# Patient Record
Sex: Female | Born: 1975
Health system: Southern US, Community
[De-identification: ages and names within clinical notes are randomized; demographics above are authoritative.]

## PROBLEM LIST (undated history)

## (undated) DIAGNOSIS — R51 Headache: Secondary | ICD-10-CM

## (undated) DIAGNOSIS — O009 Unspecified ectopic pregnancy without intrauterine pregnancy: Secondary | ICD-10-CM

## (undated) DIAGNOSIS — R519 Headache, unspecified: Secondary | ICD-10-CM

## (undated) DIAGNOSIS — I10 Essential (primary) hypertension: Secondary | ICD-10-CM

## (undated) DIAGNOSIS — G43909 Migraine, unspecified, not intractable, without status migrainosus: Secondary | ICD-10-CM

## (undated) HISTORY — DX: Headache, unspecified: R51.9

## (undated) HISTORY — DX: Headache: R51

## (undated) HISTORY — DX: Migraine, unspecified, not intractable, without status migrainosus: G43.909

## (undated) HISTORY — PX: ECTOPIC PREGNANCY SURGERY: SHX613

---
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
## 2011-12-16 ENCOUNTER — Encounter (HOSPITAL_BASED_OUTPATIENT_CLINIC_OR_DEPARTMENT_OTHER): Payer: Self-pay | Admitting: *Deleted

## 2011-12-16 DIAGNOSIS — Z79899 Other long term (current) drug therapy: Secondary | ICD-10-CM | POA: Insufficient documentation

## 2011-12-16 DIAGNOSIS — I10 Essential (primary) hypertension: Secondary | ICD-10-CM | POA: Insufficient documentation

## 2011-12-16 DIAGNOSIS — K047 Periapical abscess without sinus: Secondary | ICD-10-CM | POA: Insufficient documentation

## 2011-12-16 DIAGNOSIS — K089 Disorder of teeth and supporting structures, unspecified: Secondary | ICD-10-CM | POA: Insufficient documentation

## 2011-12-16 NOTE — ED Notes (Signed)
C/o pain to lower right tooth

## 2011-12-17 ENCOUNTER — Emergency Department (HOSPITAL_BASED_OUTPATIENT_CLINIC_OR_DEPARTMENT_OTHER)
Admission: EM | Admit: 2011-12-17 | Discharge: 2011-12-17 | Disposition: A | Discharge status: Home / Self Care | Payer: 59 | Attending: Emergency Medicine | Admitting: Emergency Medicine

## 2011-12-17 DIAGNOSIS — I10 Essential (primary) hypertension: Secondary | ICD-10-CM

## 2011-12-17 DIAGNOSIS — K047 Periapical abscess without sinus: Secondary | ICD-10-CM

## 2011-12-17 DIAGNOSIS — K0889 Other specified disorders of teeth and supporting structures: Secondary | ICD-10-CM

## 2011-12-17 HISTORY — DX: Unspecified ectopic pregnancy without intrauterine pregnancy: O00.90

## 2011-12-17 HISTORY — DX: Essential (primary) hypertension: I10

## 2011-12-17 MED ORDER — AMOXICILLIN 500 MG PO CAPS: 500.0000 mg | ORAL_CAPSULE | Freq: Three times a day (TID) | ORAL | Status: DC

## 2011-12-17 MED ORDER — HYDROCODONE-ACETAMINOPHEN 5-325 MG PO TABS
2.0000 | ORAL_TABLET | Freq: Once | ORAL | Status: AC
  Administered 2011-12-17: 2 via ORAL
  Filled 2011-12-17: qty 2

## 2011-12-17 MED ORDER — HYDROCODONE-ACETAMINOPHEN 5-500 MG PO TABS: 1.0000 | ORAL_TABLET | Freq: Four times a day (QID) | ORAL | Status: DC | PRN

## 2011-12-17 NOTE — ED Provider Notes (Signed)
History     CSN: [id number]  Arrival date & time 12/16/11  2207   First MD Initiated Contact with Patient 12/17/11 0016      Chief Complaint  Patient presents with  . Dental Pain    (Consider location/radiation/quality/duration/timing/severity/associated sxs/prior treatment) Patient is a 36 y.o. female presenting with tooth pain. The history is provided by the patient.  Dental PainPrimary symptoms do not include headaches, fever or shortness of breath.  Additional symptoms do not include: trouble swallowing.  pt c/o right lower dental pain for past 2-3 days. Constant. Dull. Mod-severe. Worse w chewing. No face or neck swelling. No trouble breathing or swallowing. No fever or chills. Same tooth had previously broken off. No local dentist.   Past Medical History  Diagnosis Date  . Hypertension   . Ectopic pregnancy without intrauterine pregnancy     History reviewed. No pertinent past surgical history.  History reviewed. No pertinent family history.  History  Substance Use Topics  . Smoking status: Never Smoker   . Smokeless tobacco: Not on file  . Alcohol Use: No    OB History    Grav Para Term Preterm Abortions TAB SAB Ect Mult Living                  Review of Systems  Constitutional: Negative for fever.  HENT: Negative for trouble swallowing.   Respiratory: Negative for shortness of breath.   Neurological: Negative for headaches.    Allergies  Review of patient's allergies indicates no known allergies.  Home Medications   Current Outpatient Rx  Name  Route  Sig  Dispense  Refill  . HYDROCHLOROTHIAZIDE 25 MG PO TABS   Oral   Take 25 mg by mouth daily.           BP 179/106  Pulse 88  Temp 98.2 F (36.8 C) (Oral)  Resp 20  Ht 5\' 3"  (1.6 m)  Wt 270 lb (122.471 kg)  BMI 47.83 kg/m2  SpO2 100%  Physical Exam  Nursing note and vitals reviewed. Constitutional: She appears well-developed and well-nourished. No distress.  HENT:  Mouth/Throat:  Oropharynx is clear and moist.       Right lower dental decay, broken off above gumline, associated gum swelling and tenderness. No trismus. No pharyngeal swelling. No pain, swelling or tenderness to face, floor of mouth or neck.   Eyes: Conjunctivae normal are normal. No scleral icterus.  Neck: Neck supple. No tracheal deviation present.  Cardiovascular: Normal rate.   Pulmonary/Chest: Effort normal. No respiratory distress.  Abdominal: Normal appearance. She exhibits no distension.  Musculoskeletal: She exhibits no edema.  Lymphadenopathy:    She has no cervical adenopathy.  Neurological: She is alert.  Skin: Skin is warm and dry. No rash noted.  Psychiatric: She has a normal mood and affect.    ED Course  Procedures (including critical care time)     MDM  Pt has ride, does not have to drive. took motrin earlier. Confirmed nkda w pt.  vicodin 2 po.          Suzi Roots, MD 12/17/11 [phone]

## 2011-12-17 NOTE — Discharge Instructions (Signed)
Take antibiotic (amoxicillin) as prescribed. Take motrin or aleve as need for pain. You may also take vicodin as need for pain. No driving for the next 6 hours or when taking vicodin. Also, do not take tylenol or acetaminophen containing medication when taking vicodin. Follow up with dentist in the next few days  - call office to arrange follow up appointment.  Return to ER if worse, facial/neck swelling, high fevers, intractable pain, trouble breathing or swallowing, other concern.    Your blood pressure is high today - follow up with primary care doctor for recheck in the coming week.     Dental Pain A tooth ache may be caused by cavities (tooth decay). Cavities expose the nerve of the tooth to air and hot or cold temperatures. It may come from an infection or abscess (also called a boil or furuncle) around your tooth. It is also often caused by dental caries (tooth decay). This causes the pain you are having. DIAGNOSIS  Your caregiver can diagnose this problem by exam. TREATMENT   If caused by an infection, it may be treated with medications which kill germs (antibiotics) and pain medications as prescribed by your caregiver. Take medications as directed.  Only take over-the-counter or prescription medicines for pain, discomfort, or fever as directed by your caregiver.  Whether the tooth ache today is caused by infection or dental disease, you should see your dentist as soon as possible for further care. SEEK MEDICAL CARE IF: The exam and treatment you received today has been provided on an emergency basis only. This is not a substitute for complete medical or dental care. If your problem worsens or new problems (symptoms) appear, and you are unable to meet with your dentist, call or return to this location. SEEK IMMEDIATE MEDICAL CARE IF:   You have a fever.  You develop redness and swelling of your face, jaw, or neck.  You are unable to open your mouth.  You have severe pain  uncontrolled by pain medicine. MAKE SURE YOU:   Understand these instructions.  Will watch your condition.  Will get help right away if you are not doing well or get worse. Document Released: 01/24/2005 Document Revised: 04/18/2011 Document Reviewed: 09/12/2007 [facility] Patient Information 2013 [place], [place].     Dental Abscess A dental abscess is a collection of infected fluid (pus) from a bacterial infection in the inner part of the tooth (pulp). It usually occurs at the end of the tooth's root.  CAUSES   Severe tooth decay.  Trauma to the tooth that allows bacteria to enter into the pulp, such as a broken or chipped tooth. SYMPTOMS   Severe pain in and around the infected tooth.  Swelling and redness around the abscessed tooth or in the mouth or face.  Tenderness.  Pus drainage.  Bad breath.  Bitter taste in the mouth.  Difficulty swallowing.  Difficulty opening the mouth.  Nausea.  Vomiting.  Chills.  Swollen neck glands. DIAGNOSIS   A medical and dental history will be taken.  An examination will be performed by tapping on the abscessed tooth.  X-rays may be taken of the tooth to identify the abscess. TREATMENT The goal of treatment is to eliminate the infection. You may be prescribed antibiotic medicine to stop the infection from spreading. A root canal may be performed to save the tooth. If the tooth cannot be saved, it may be pulled (extracted) and the abscess may be drained.  HOME CARE INSTRUCTIONS  Only take  over-the-counter or prescription medicines for pain, fever, or discomfort as directed by your caregiver.  Rinse your mouth (gargle) often with salt water ( tsp salt in 8 oz of warm water) to relieve pain or swelling.  Do not drive after taking pain medicine (narcotics).  Do not apply heat to the outside of your face.  Return to your dentist for further treatment as directed. SEEK MEDICAL CARE IF:  Your pain is not helped by  medicine.  Your pain is getting worse instead of better. SEEK IMMEDIATE MEDICAL CARE IF:  You have a fever or persistent symptoms for more than 2 3 days.  You have a fever and your symptoms suddenly get worse.  You have chills or a very bad headache.  You have problems breathing or swallowing.  You have trouble opening your mouth.  You have swelling in the neck or around the eye. Document Released: 01/24/2005 Document Revised: 07/26/2011 Document Reviewed: 05/04/2010 [facility] Patient Information 2013 [place], [place].    Hypertension As your heart beats, it forces blood through your arteries. This force is your blood pressure. If the pressure is too high, it is called hypertension (HTN) or high blood pressure. HTN is dangerous because you may have it and not know it. High blood pressure may mean that your heart has to work harder to pump blood. Your arteries may be narrow or stiff. The extra work puts you at risk for heart disease, stroke, and other problems.  Blood pressure consists of two numbers, a higher number over a lower, 110/72, for example. It is stated as "110 over 72." The ideal is below 120 for the top number (systolic) and under 80 for the bottom (diastolic). Write down your blood pressure today. You should pay close attention to your blood pressure if you have certain conditions such as:  Heart failure.  Prior heart attack.  Diabetes  Chronic kidney disease.  Prior stroke.  Multiple risk factors for heart disease. To see if you have HTN, your blood pressure should be measured while you are seated with your arm held at the level of the heart. It should be measured at least twice. A one-time elevated blood pressure reading (especially in the Emergency Department) does not mean that you need treatment. There may be conditions in which the blood pressure is different between your right and left arms. It is important to see your caregiver soon for a recheck. Most people  have essential hypertension which means that there is not a specific cause. This type of high blood pressure may be lowered by changing lifestyle factors such as:  Stress.  Smoking.  Lack of exercise.  Excessive weight.  Drug/tobacco/alcohol use.  Eating less salt. Most people do not have symptoms from high blood pressure until it has caused damage to the body. Effective treatment can often prevent, delay or reduce that damage. TREATMENT  When a cause has been identified, treatment for high blood pressure is directed at the cause. There are a large number of medications to treat HTN. These fall into several categories, and your caregiver will help you select the medicines that are best for you. Medications may have side effects. You should review side effects with your caregiver. If your blood pressure stays high after you have made lifestyle changes or started on medicines,   Your medication(s) may need to be changed.  Other problems may need to be addressed.  Be certain you understand your prescriptions, and know how and when to take your medicine.  Be sure to follow up with your caregiver within the time frame advised (usually within two weeks) to have your blood pressure rechecked and to review your medications.  If you are taking more than one medicine to lower your blood pressure, make sure you know how and at what times they should be taken. Taking two medicines at the same time can result in blood pressure that is too low. SEEK IMMEDIATE MEDICAL CARE IF:  You develop a severe headache, blurred or changing vision, or confusion.  You have unusual weakness or numbness, or a faint feeling.  You have severe chest or abdominal pain, vomiting, or breathing problems. MAKE SURE YOU:   Understand these instructions.  Will watch your condition.  Will get help right away if you are not doing well or get worse. Document Released: 01/24/2005 Document Revised: 04/18/2011 Document  Reviewed: 09/14/2007 [facility] Patient Information 2013 [place], [place].     RESOURCE GUIDE  Chronic Pain Problems: Contact [facility]  [phone] Patients need to be referred by their primary care doctor.  Insufficient Money for Medicine: Contact United Way:  call "211" or [facility] 3653540222.  No Primary Care Doctor: - Call [facility]  [phone] - can help you locate a primary care doctor that  accepts your insurance, provides certain services, etc. - Physician Referral Service- [phone]  Agencies that provide inexpensive medical care: - [facility]  [phone] - [facility]  [phone] - [facility]  [phone] - Women's Clinic  [phone] - Planned Parenthood  [phone] [facility]  [phone]  Medicaid-accepting [facility] Providers: - [facility]- [address]  [phone], Mon-Fri 9am-7pm, Sat 9am-1pm - [facility]- [address] [place], [address] [place]  [phone] - [facility]- [address] [place]  [phone] [facility]- [address]  [phone] - Renaye Rakers- [address] [place], [address], [phone]  Only accepts [place] patients after they have their name  applied to their card  Self Pay (no insurance) in [place]: - Sickle Cell Patients: Dr Willey Blade, [facility]  [address] [place], [phone] - [facility]- [address] [place]  [phone]       [facility]- [address]       -     [facility]- see information above (Speak to [employer] if you do not have insurance)       -  [facility]- [address] [place], [phone]       -  [facility]- [address],  [phone]       -  [facility]- [address], [phone]       -  Dr Julio Sicks-  [address], [place], [phone]       -  [facility]- [address] [phone]       -  [facility]- [address], [phone], also [address], [phone]       -    [facility]- [address] [place], [phone], 1st & 3rd Saturday   every month, 10am-1pm  1) Find a Doctor and Pay Out of Pocket Although you won't have to find  out who is covered by your insurance plan, it is a good idea to ask around and get recommendations. You will then need to call the office and see if the doctor you have chosen will accept you as a new patient and what types of options they offer for patients who are self-pay. Some doctors offer discounts or will set up payment plans for their patients who do not have insurance, but you will need to ask so you aren't surprised when you get to your appointment.  2) Contact Your Local Health Department Not all health departments have doctors that can see patients for sick visits, but many do, so it is worth a call to see if yours does. If you don't know where your local health department is, you can check in your phone book. The CDC also has a tool to help you locate your state's health department, and many state websites also have listings of all of their local health departments.  3) Find a Walk-in Clinic If your illness is not likely to be very severe or complicated, you may want to try a walk in clinic. These are popping up all over the country in pharmacies, drugstores, and shopping centers. They're usually staffed by nurse practitioners or physician assistants that have been trained to treat common illnesses and complaints. They're usually fairly quick and inexpensive. However, if you have serious medical issues or chronic medical problems, these are probably not your best option  STD Testing - [facility] Department of [facility], STD Clinic, [address] [place], phone [phone] or  [phone].  Monday - Friday, call for an appointment. [facility] Department of [employer], STD Clinic, [address], [place], phone [phone] or [phone].  Monday - Friday, call for an appointment.  Abuse/Neglect: [facility] Child Abuse Hotline (906) 155-1085 [facility] Child Abuse Hotline [phone] (After Hours)  Emergency Shelter:  [facility] [phone]  Maternity Homes: - Room at the [place] [facility] (616)522-6281 - [facility] [phone]  MRSA Hotline #:   (808) 347-0446  [facility] Resources  [facility] [place]  United Way [facility] Dept. [address]                 [address]         [address] [place] [address]  Blondell Reveal Phone:  [phone]                                  Phone:  [phone]                   Phone:  [phone]  [facility], [phone] - [facility]([phone])[phone]       -     [facility] in [place], [address],  ([phone], [facility] Child Abuse Hotline (407)465-8308 or (647) 602-0339 (After Hours)   Behavioral Health Services  Substance Abuse Resources: - [facility] and Drug Services  401-679-7466 - [facility] (571)378-1619 - [facility] (586)588-8243 Floydene Flock (667)038-6651 - Residential & Outpatient Substance Abuse Program  9417996279  Psychological Services: [facility]  ([phone] [facility]  [phone] - [facility], [phone] [place]. [address], [place], ACCESS LINE: 920-800-8233 or (331)774-5678, [employer]  Dental Assistance  If unable to pay or uninsured, contact:  [facility] or  [facility]. to become qualified for the adult dental clinic.  Patients with Medicaid: [facility] [phone] [address], [phone] [address] [phone]  If unable to pay, or uninsured, contact [facility] [phone]) or [facility] Department [phone] in [place], [phone] in [facility]) to become qualified for the adult dental clinic  Other Low-Cost Community Dental Services: - [facility]- [address] [place], [place], [place], [address], [phone], [address]. [phone], 2nd and 4th Thursday of the month at 6:30am.  10 clients each day by appointment, can sometimes see walk-in patients if someone does not show for an appointment. [facility]- [address] [place], [place], [address], [phone] - [facility]- [address], [place], [place], [address], [phone] - [facility]- [phone] [facility] Health Department- [phone] [facility] Department- [phone]

## 2012-11-28 ENCOUNTER — Emergency Department (HOSPITAL_BASED_OUTPATIENT_CLINIC_OR_DEPARTMENT_OTHER)
Admission: EM | Admit: 2012-11-28 | Discharge: 2012-11-28 | Disposition: A | Discharge status: Home / Self Care | Payer: 59 | Attending: Emergency Medicine | Admitting: Emergency Medicine

## 2012-11-28 ENCOUNTER — Encounter (HOSPITAL_BASED_OUTPATIENT_CLINIC_OR_DEPARTMENT_OTHER): Payer: Self-pay | Admitting: Emergency Medicine

## 2012-11-28 DIAGNOSIS — Z5189 Encounter for other specified aftercare: Secondary | ICD-10-CM | POA: Insufficient documentation

## 2012-11-28 DIAGNOSIS — K029 Dental caries, unspecified: Secondary | ICD-10-CM | POA: Insufficient documentation

## 2012-11-28 DIAGNOSIS — I10 Essential (primary) hypertension: Secondary | ICD-10-CM | POA: Insufficient documentation

## 2012-11-28 DIAGNOSIS — Z792 Long term (current) use of antibiotics: Secondary | ICD-10-CM | POA: Insufficient documentation

## 2012-11-28 DIAGNOSIS — Z79899 Other long term (current) drug therapy: Secondary | ICD-10-CM | POA: Insufficient documentation

## 2012-11-28 MED ORDER — PENICILLIN V POTASSIUM 500 MG PO TABS: 500.0000 mg | ORAL_TABLET | Freq: Three times a day (TID) | ORAL | Status: DC

## 2012-11-28 MED ORDER — HYDROCODONE-ACETAMINOPHEN 5-325 MG PO TABS: 1.0000 | ORAL_TABLET | Freq: Four times a day (QID) | ORAL | Status: DC | PRN

## 2012-11-28 NOTE — ED Notes (Signed)
Pt c/o left sided dental pain x2 days 

## 2012-11-28 NOTE — ED Provider Notes (Signed)
CSN: [id number]     Arrival date & time 11/28/12  0004 History   First MD Initiated Contact with Patient 11/28/12 0010     Chief Complaint  Patient presents with  . Dental Pain   (Consider location/radiation/quality/duration/timing/severity/associated sxs/prior Treatment) Patient is a 37 y.o. female presenting with tooth pain. The history is provided by the patient. A language interpreter was used.  Dental Pain Location:  Lower Lower teeth location:  19/LL 1st molar, 18/LL 2nd molar and 17/LL 3rd molar Quality:  Dull Severity:  Severe Onset quality:  Gradual Duration:  2 days Timing:  Constant Progression:  Unchanged Chronicity:  Recurrent Context: dental caries, dental fracture and poor dentition   Context: not abscess   Previous work-up:  Dental exam and filled cavity Relieved by:  Nothing Worsened by:  Nothing tried Ineffective treatments:  None tried Associated symptoms: neck swelling   Associated symptoms: no congestion and no fever     Past Medical History  Diagnosis Date  . Hypertension   . Ectopic pregnancy without intrauterine pregnancy    History reviewed. No pertinent past surgical history. No family history on file. History  Substance Use Topics  . Smoking status: Never Smoker   . Smokeless tobacco: Not on file  . Alcohol Use: No   OB History   Grav Para Term Preterm Abortions TAB SAB Ect Mult Living                 Review of Systems  Constitutional: Negative for fever.  HENT: Negative for congestion.   All other systems reviewed and are negative.    Allergies  Review of patient's allergies indicates no known allergies.  Home Medications   Current Outpatient Rx  Name  Route  Sig  Dispense  Refill  . amoxicillin (AMOXIL) 500 MG capsule   Oral   Take 1 capsule (500 mg total) by mouth 3 (three) times daily.   21 capsule   0   . hydrochlorothiazide (HYDRODIURIL) 25 MG tablet   Oral   Take 25 mg by mouth daily.         Marland Kitchen  HYDROcodone-acetaminophen (NORCO) 5-325 MG per tablet   Oral   Take 1 tablet by mouth every 6 (six) hours as needed for pain.   10 tablet   0   . HYDROcodone-acetaminophen (VICODIN) 5-500 MG per tablet   Oral   Take 1-2 tablets by mouth every 6 (six) hours as needed for pain.   20 tablet   0   . penicillin v potassium (VEETID) 500 MG tablet   Oral   Take 1 tablet (500 mg total) by mouth 3 (three) times daily.   30 tablet   0    BP 176/100  Pulse 89  Temp(Src) 98.9 F (37.2 C) (Oral)  Resp 18  Ht 5\' 3"  (1.6 m)  Wt 300 lb (136.079 kg)  BMI 53.16 kg/m2  SpO2 99% Physical Exam  Constitutional: She is oriented to person, place, and time. She appears well-developed and well-nourished. No distress.  HENT:  Head: Normocephalic and atraumatic.  Mouth/Throat: Oropharynx is clear and moist.    Eyes: Conjunctivae are normal. Pupils are equal, round, and reactive to light.  Neck: Normal range of motion. Neck supple.  Cardiovascular: Normal rate and regular rhythm.   Pulmonary/Chest: Effort normal and breath sounds normal. She has no wheezes. She has no rales.  Abdominal: Soft. Bowel sounds are normal. There is no tenderness. There is no rebound and no guarding.  Musculoskeletal:  Normal range of motion.  Lymphadenopathy:    She has no cervical adenopathy.  Neurological: She is alert and oriented to person, place, and time.  Skin: Skin is warm and dry.  Psychiatric: She has a normal mood and affect.    ED Course  Procedures (including critical care time) Labs Review Labs Reviewed - No data to display Imaging Review No results found.  EKG Interpretation   None       MDM   1. Dental caries    PCN/ pain medication/ follow up with dentistry     Erza Mothershead Smitty Cords, MD 11/28/12 0020

## 2012-11-28 NOTE — ED Notes (Signed)
Pt states she has not been taking BP medication x 1 month. Pt states she thought she could come off of it. Educated pt about need to take medications as prescribed.

## 2015-05-15 ENCOUNTER — Emergency Department (HOSPITAL_BASED_OUTPATIENT_CLINIC_OR_DEPARTMENT_OTHER)
Admission: EM | Admit: 2015-05-15 | Discharge: 2015-05-15 | Disposition: A | Discharge status: Home / Self Care | Payer: Medicaid Other | Attending: Emergency Medicine | Admitting: Emergency Medicine

## 2015-05-15 ENCOUNTER — Encounter (HOSPITAL_BASED_OUTPATIENT_CLINIC_OR_DEPARTMENT_OTHER): Payer: Self-pay | Admitting: *Deleted

## 2015-05-15 DIAGNOSIS — Z792 Long term (current) use of antibiotics: Secondary | ICD-10-CM | POA: Diagnosis not present

## 2015-05-15 DIAGNOSIS — N760 Acute vaginitis: Secondary | ICD-10-CM

## 2015-05-15 DIAGNOSIS — O23591 Infection of other part of genital tract in pregnancy, first trimester: Secondary | ICD-10-CM | POA: Diagnosis not present

## 2015-05-15 DIAGNOSIS — O9989 Other specified diseases and conditions complicating pregnancy, childbirth and the puerperium: Secondary | ICD-10-CM | POA: Diagnosis present

## 2015-05-15 DIAGNOSIS — Z79899 Other long term (current) drug therapy: Secondary | ICD-10-CM | POA: Diagnosis not present

## 2015-05-15 DIAGNOSIS — O10011 Pre-existing essential hypertension complicating pregnancy, first trimester: Secondary | ICD-10-CM | POA: Insufficient documentation

## 2015-05-15 DIAGNOSIS — B9689 Other specified bacterial agents as the cause of diseases classified elsewhere: Secondary | ICD-10-CM

## 2015-05-15 DIAGNOSIS — Z3A01 Less than 8 weeks gestation of pregnancy: Secondary | ICD-10-CM | POA: Diagnosis not present

## 2015-05-15 LAB — URINALYSIS, ROUTINE W REFLEX MICROSCOPIC
Bilirubin Urine: NEGATIVE
GLUCOSE, UA: NEGATIVE mg/dL
Hgb urine dipstick: NEGATIVE
Ketones, ur: NEGATIVE mg/dL
Nitrite: NEGATIVE
PH: 6 (ref 5.0–8.0)
Protein, ur: NEGATIVE mg/dL
SPECIFIC GRAVITY, URINE: 1.024 (ref 1.005–1.030)

## 2015-05-15 LAB — URINE MICROSCOPIC-ADD ON: RBC / HPF: NONE SEEN RBC/hpf (ref 0–5)

## 2015-05-15 LAB — WET PREP, GENITAL
SPERM: NONE SEEN
Trich, Wet Prep: NONE SEEN
Yeast Wet Prep HPF POC: NONE SEEN

## 2015-05-15 MED ORDER — METRONIDAZOLE 500 MG PO TABS: 500.0000 mg | ORAL_TABLET | Freq: Two times a day (BID) | ORAL | Status: DC

## 2015-05-15 MED FILL — metroNIDAZOLE 500 MG TABS: 500 | 7 days supply | Qty: 14 | Fill #0

## 2015-05-15 NOTE — ED Provider Notes (Signed)
CSN: [id number]     Arrival date & time 05/15/15  1345 History   First MD Initiated Contact with Patient 05/15/15 1358     Chief Complaint  Patient presents with  . Vaginal Discharge     HPI Patient presents with new vaginal discharge over the past 24 hours.  She is currently [redacted] weeks pregnant.  She has had a first trimester ultrasound and she reports that she was told everything seems to be normal bus far.  She does have a history of bacterial vaginosis.  She does not have a new sexual partners.  She denies vaginal bleeding.  She describes this as a foul-smelling vaginal discharge.  No vaginal itching.  She was on antibiotics in February but has not been on antibiotics since then.  She does not douche.  She has been having intercourse with her husband more the past 2 weeks.   Past Medical History  Diagnosis Date  . Hypertension   . Ectopic pregnancy without intrauterine pregnancy    History reviewed. No pertinent past surgical history. No family history on file. Social History  Substance Use Topics  . Smoking status: Never Smoker   . Smokeless tobacco: None  . Alcohol Use: No   OB History    Gravida Para Term Preterm AB TAB SAB Ectopic Multiple Living   1              Review of Systems  All other systems reviewed and are negative.     Allergies  Review of patient's allergies indicates no known allergies.  Home Medications   Prior to Admission medications   Medication Sig Start Date End Date Taking? Authorizing Provider  amoxicillin (AMOXIL) 500 MG capsule Take 1 capsule (500 mg total) by mouth 3 (three) times daily. 12/17/11   Cathren Laine, MD  hydrochlorothiazide (HYDRODIURIL) 25 MG tablet Take 25 mg by mouth daily.    Historical Provider, MD  HYDROcodone-acetaminophen (NORCO) 5-325 MG per tablet Take 1 tablet by mouth every 6 (six) hours as needed for pain. 11/28/12   April Palumbo, MD  HYDROcodone-acetaminophen (VICODIN) 5-500 MG per tablet Take 1-2 tablets by mouth  every 6 (six) hours as needed for pain. 12/17/11   Cathren Laine, MD  penicillin v potassium (VEETID) 500 MG tablet Take 1 tablet (500 mg total) by mouth 3 (three) times daily. 11/28/12   April Palumbo, MD   BP 114/56 mmHg  Pulse 82  Temp(Src) 98.7 F (37.1 C) (Oral)  Resp 18  Ht  (1.6 m)  Wt 315 lb (142.883 kg)  BMI 55.81 kg/m2  SpO2 100% Physical Exam  Constitutional: She is oriented to person, place, and time. She appears well-developed and well-nourished.  HENT:  Head: Normocephalic.  Eyes: EOM are normal.  Neck: Normal range of motion.  Pulmonary/Chest: Effort normal.  Abdominal: She exhibits no distension. There is no tenderness.  Genitourinary:  Normal external genitalia.  Normal-appearing cervix.  No vaginal bleeding encountered.  Small amount of scant vaginal discharge  Musculoskeletal: Normal range of motion.  Neurological: She is alert and oriented to person, place, and time.  Psychiatric: She has a normal mood and affect.  Nursing note and vitals reviewed.   ED Course  Procedures (including critical care time) Labs Review Labs Reviewed  WET PREP, GENITAL - Abnormal; Notable for the following:    Clue Cells Wet Prep HPF POC PRESENT (*)    WBC, Wet Prep HPF POC MODERATE (*)    All other components within normal  limits  URINALYSIS, ROUTINE W REFLEX MICROSCOPIC (NOT AT [facility]) - Abnormal; Notable for the following:    APPearance CLOUDY (*)    Leukocytes, UA TRACE (*)    All other components within normal limits  URINE MICROSCOPIC-ADD ON - Abnormal; Notable for the following:    Squamous Epithelial / LPF 6-30 (*)    Bacteria, UA MANY (*)    All other components within normal limits  GC/CHLAMYDIA PROBE AMP ([place]) NOT AT [facility]    Imaging Review No results found. I have personally reviewed and evaluated these images and lab results as part of my medical decision-making.   EKG Interpretation None      MDM   Final diagnoses:  Bacterial vaginosis     Patient be treated for bacterial vaginosis with 7 days of twice a day Flagyl.  Treatment during first trimester pregnancy consistent with the recommendations of the [facility]   Azalia BilisKevin Reshawn Ostlund, MD 05/15/15 [phone]

## 2015-05-15 NOTE — ED Notes (Addendum)
Vaginal discharge. States she is [redacted] weeks pregnant. She has had a positive pregnancy test and an US that showed an intrauterine pregnancy.

## 2015-05-15 NOTE — Discharge Instructions (Signed)

## 2015-05-18 LAB — GC/CHLAMYDIA PROBE AMP (~~LOC~~) NOT AT ARMC
Chlamydia: NEGATIVE
NEISSERIA GONORRHEA: NEGATIVE

## 2015-06-04 ENCOUNTER — Other Ambulatory Visit (HOSPITAL_COMMUNITY): Payer: Self-pay | Admitting: Obstetrics and Gynecology

## 2015-06-04 ENCOUNTER — Encounter (HOSPITAL_BASED_OUTPATIENT_CLINIC_OR_DEPARTMENT_OTHER): Payer: Self-pay | Admitting: *Deleted

## 2015-06-04 ENCOUNTER — Emergency Department (HOSPITAL_BASED_OUTPATIENT_CLINIC_OR_DEPARTMENT_OTHER)
Admission: EM | Admit: 2015-06-04 | Discharge: 2015-06-04 | Disposition: A | Discharge status: Home / Self Care | Payer: Medicaid Other | Attending: Emergency Medicine | Admitting: Emergency Medicine

## 2015-06-04 ENCOUNTER — Ambulatory Visit (HOSPITAL_COMMUNITY)
Admission: RE | Admit: 2015-06-04 | Discharge: 2015-06-04 | Disposition: A | Discharge status: Home / Self Care | Payer: Medicaid Other | Source: Ambulatory Visit | Attending: Obstetrics and Gynecology | Admitting: Obstetrics and Gynecology

## 2015-06-04 DIAGNOSIS — O161 Unspecified maternal hypertension, first trimester: Secondary | ICD-10-CM | POA: Insufficient documentation

## 2015-06-04 DIAGNOSIS — Z3A10 10 weeks gestation of pregnancy: Secondary | ICD-10-CM | POA: Insufficient documentation

## 2015-06-04 DIAGNOSIS — Z79899 Other long term (current) drug therapy: Secondary | ICD-10-CM | POA: Diagnosis not present

## 2015-06-04 DIAGNOSIS — O3680X1 Pregnancy with inconclusive fetal viability, fetus 1: Secondary | ICD-10-CM

## 2015-06-04 DIAGNOSIS — O4691 Antepartum hemorrhage, unspecified, first trimester: Secondary | ICD-10-CM | POA: Diagnosis present

## 2015-06-04 DIAGNOSIS — O021 Missed abortion: Secondary | ICD-10-CM | POA: Diagnosis not present

## 2015-06-04 DIAGNOSIS — O200 Threatened abortion: Secondary | ICD-10-CM | POA: Insufficient documentation

## 2015-06-04 DIAGNOSIS — R938 Abnormal findings on diagnostic imaging of other specified body structures: Secondary | ICD-10-CM | POA: Insufficient documentation

## 2015-06-04 DIAGNOSIS — O209 Hemorrhage in early pregnancy, unspecified: Secondary | ICD-10-CM | POA: Diagnosis present

## 2015-06-04 LAB — BASIC METABOLIC PANEL
ANION GAP: 5 (ref 5–15)
BUN: 12 mg/dL (ref 6–20)
CHLORIDE: 106 mmol/L (ref 101–111)
CO2: 25 mmol/L (ref 22–32)
Calcium: 8.7 mg/dL — ABNORMAL LOW (ref 8.9–10.3)
Creatinine, Ser: 0.68 mg/dL (ref 0.44–1.00)
GFR calc Af Amer: >60 mL/min (ref >60)
GLUCOSE: 114 mg/dL — AB (ref 65–99)
POTASSIUM: 3.1 mmol/L — AB (ref 3.5–5.1)
Sodium: 136 mmol/L (ref 135–145)

## 2015-06-04 LAB — URINALYSIS, ROUTINE W REFLEX MICROSCOPIC
Bilirubin Urine: NEGATIVE
Glucose, UA: NEGATIVE mg/dL
Ketones, ur: 15 mg/dL — AB
Nitrite: NEGATIVE
Protein, ur: 30 mg/dL — AB
SPECIFIC GRAVITY, URINE: 1.023 (ref 1.005–1.030)
pH: 6 (ref 5.0–8.0)

## 2015-06-04 LAB — CBC WITH DIFFERENTIAL/PLATELET
BASOS ABS: 0 10*3/uL (ref 0.0–0.1)
Basophils Relative: 0 %
EOS PCT: 1 %
Eosinophils Absolute: 0.1 10*3/uL (ref 0.0–0.7)
HEMATOCRIT: 34.6 % — AB (ref 36.0–46.0)
HEMOGLOBIN: 11.6 g/dL — AB (ref 12.0–15.0)
LYMPHS ABS: 3.1 10*3/uL (ref 0.7–4.0)
LYMPHS PCT: 28 %
MCH: 27 pg (ref 26.0–34.0)
MCHC: 33.5 g/dL (ref 30.0–36.0)
MCV: 80.5 fL (ref 78.0–100.0)
Monocytes Absolute: 0.8 10*3/uL (ref 0.1–1.0)
Monocytes Relative: 7 %
NEUTROS ABS: 6.9 10*3/uL (ref 1.7–7.7)
Neutrophils Relative %: 64 %
PLATELETS: 372 10*3/uL (ref 150–400)
RBC: 4.3 MIL/uL (ref 3.87–5.11)
RDW: 14.9 % (ref 11.5–15.5)
WBC: 11 10*3/uL — AB (ref 4.0–10.5)

## 2015-06-04 LAB — WET PREP, GENITAL
Clue Cells Wet Prep HPF POC: NONE SEEN
SPERM: NONE SEEN
TRICH WET PREP: NONE SEEN
WBC WET PREP: NONE SEEN
YEAST WET PREP: NONE SEEN

## 2015-06-04 LAB — PREGNANCY, URINE: PREG TEST UR: POSITIVE — AB

## 2015-06-04 LAB — URINE MICROSCOPIC-ADD ON

## 2015-06-04 LAB — ABO/RH: ABO/RH(D): O POS

## 2015-06-04 LAB — GC/CHLAMYDIA PROBE AMP (~~LOC~~) NOT AT ARMC
CHLAMYDIA, DNA PROBE: NEGATIVE
Neisseria Gonorrhea: NEGATIVE

## 2015-06-04 LAB — HCG, QUANTITATIVE, PREGNANCY: HCG, BETA CHAIN, QUANT, S: 3926 m[IU]/mL — AB (ref <5)

## 2015-06-04 MED ORDER — POTASSIUM CHLORIDE CRYS ER 20 MEQ PO TBCR
40.0000 meq | EXTENDED_RELEASE_TABLET | Freq: Once | ORAL | Status: AC
  Administered 2015-06-04: 40 meq via ORAL
  Filled 2015-06-04: qty 2

## 2015-06-04 NOTE — ED Notes (Signed)
Woke w vaginal bleeding and clots,  Having abd pressure but denies pain

## 2015-06-04 NOTE — ED Notes (Signed)
Pt states woke few minutes ago with vaginal bleeding, w clots states 10 weeks preg,  Denies pain  Just pressure

## 2015-06-04 NOTE — Discharge Instructions (Signed)
Threatened Miscarriage °A threatened miscarriage occurs when you have vaginal bleeding during your first 20 weeks of pregnancy but the pregnancy has not ended. If you have vaginal bleeding during this time, your health care provider will do tests to make sure you are still pregnant. If the tests show you are still pregnant and the developing baby (fetus) inside your womb (uterus) is still growing, your condition is considered a threatened miscarriage. °A threatened miscarriage does not mean your pregnancy will end, but it does increase the risk of losing your pregnancy (complete miscarriage). °CAUSES  °The cause of a threatened miscarriage is usually not known. If you go on to have a complete miscarriage, the most common cause is an abnormal number of chromosomes in the developing baby. Chromosomes are the structures inside cells that hold all your genetic material. °Some causes of vaginal bleeding that do not result in miscarriage include: °· Having sex. °· Having an infection. °· Normal hormone changes of pregnancy. °· Bleeding that occurs when an egg implants in your uterus. °RISK FACTORS °Risk factors for bleeding in early pregnancy include: °· Obesity. °· Smoking. °· Drinking excessive amounts of alcohol or caffeine. °· Recreational drug use. °SIGNS AND SYMPTOMS °· Light vaginal bleeding. °· Mild abdominal pain or cramps. °DIAGNOSIS  °If you have bleeding with or without abdominal pain before 20 weeks of pregnancy, your health care provider will do tests to check whether you are still pregnant. One important test involves using sound waves and a computer (ultrasound) to create images of the inside of your uterus. Other tests include an internal exam of your vagina and uterus (pelvic exam) and measurement of your baby's heart rate.  °You may be diagnosed with a threatened miscarriage if: °· Ultrasound testing shows you are still pregnant. °· Your baby's heart rate is strong. °· A pelvic exam shows that the  opening between your uterus and your vagina (cervix) is closed. °· Your heart rate and blood pressure are stable. °· Blood tests confirm you are still pregnant. °TREATMENT  °No treatments have been shown to prevent a threatened miscarriage from going on to a complete miscarriage. However, the right home care is important.  °HOME CARE INSTRUCTIONS  °· Make sure you keep all your appointments for prenatal care. This is very important. °· Get plenty of rest. °· Do not have sex or use tampons if you have vaginal bleeding. °· Do not douche. °· Do not smoke or use recreational drugs. °· Do not drink alcohol. °· Avoid caffeine. °SEEK MEDICAL CARE IF: °· You have light vaginal bleeding or spotting while pregnant. °· You have abdominal pain or cramping. °· You have a fever. °SEEK IMMEDIATE MEDICAL CARE IF: °· You have heavy vaginal bleeding. °· You have blood clots coming from your vagina. °· You have severe low back pain or abdominal cramps. °· You have fever, chills, and severe abdominal pain. °MAKE SURE YOU: °· Understand these instructions. °· Will watch your condition. °· Will get help right away if you are not doing well or get worse. °  °This information is not intended to replace advice given to you by your health care provider. Make sure you discuss any questions you have with your health care provider. °  °Document Released: 01/24/2005 Document Revised: 01/29/2013 Document Reviewed: 11/20/2012 °Elsevier Interactive Patient Education ©2016 [employer] ° °Vaginal Bleeding During Pregnancy, First Trimester °A small amount of bleeding (spotting) from the vagina is relatively common in early pregnancy. It usually stops on its own.   Various things may cause bleeding or spotting in early pregnancy. Some bleeding may be related to the pregnancy, and some may not. In most cases, the bleeding is normal and is not a problem. However, bleeding can also be a sign of something serious. Be sure to tell your health care provider  about any vaginal bleeding right away. °Some possible causes of vaginal bleeding during the first trimester include: °· Infection or inflammation of the cervix. °· Growths (polyps) on the cervix. °· Miscarriage or threatened miscarriage. °· Pregnancy tissue has developed outside of the uterus and in a fallopian tube (tubal pregnancy). °· Tiny cysts have developed in the uterus instead of pregnancy tissue (molar pregnancy). °HOME CARE INSTRUCTIONS  °Watch your condition for any changes. The following actions may help to lessen any discomfort you are feeling: °· Follow your health care provider's instructions for limiting your activity. If your health care provider orders bed rest, you may need to stay in bed and only get up to use the bathroom. However, your health care provider may allow you to continue light activity. °· If needed, make plans for someone to help with your regular activities and responsibilities while you are on bed rest. °· Keep track of the number of pads you use each day, how often you change pads, and how soaked (saturated) they are. Write this down. °· Do not use tampons. Do not douche. °· Do not have sexual intercourse or orgasms until approved by your health care provider. °· If you pass any tissue from your vagina, save the tissue so you can show it to your health care provider. °· Only take over-the-counter or prescription medicines as directed by your health care provider. °· Do not take aspirin because it can make you bleed. °· Keep all follow-up appointments as directed by your health care provider. °SEEK MEDICAL CARE IF: °· You have any vaginal bleeding during any part of your pregnancy. °· You have cramps or labor pains. °· You have a fever, not controlled by medicine. °SEEK IMMEDIATE MEDICAL CARE IF:  °· You have severe cramps in your back or belly (abdomen). °· You pass large clots or tissue from your vagina. °· Your bleeding increases. °· You feel light-headed or weak, or you have  fainting episodes. °· You have chills. °· You are leaking fluid or have a gush of fluid from your vagina. °· You pass out while having a bowel movement. °MAKE SURE YOU: °· Understand these instructions. °· Will watch your condition. °· Will get help right away if you are not doing well or get worse. °  °This information is not intended to replace advice given to you by your health care provider. Make sure you discuss any questions you have with your health care provider. °  °Document Released: 11/03/2004 Document Revised: 01/29/2013 Document Reviewed: 10/01/2012 °Elsevier Interactive Patient Education ©2016 Elsevier Inc. ° °

## 2015-06-04 NOTE — ED Provider Notes (Signed)
CSN: [id number]     Arrival date & time 06/04/15  4098 History   First MD Initiated Contact with Patient 06/04/15 [phone]     Chief Complaint  Patient presents with  . Vaginal Bleeding     (Consider location/radiation/quality/duration/timing/severity/associated sxs/prior Treatment) Patient is a 40 y.o. female presenting with vaginal bleeding. The history is provided by the patient.  Vaginal Bleeding Quality:  Clots and dark red Severity:  Moderate Onset quality:  Sudden Timing:  Constant Progression:  Unchanged Chronicity:  New Possible pregnancy: yes   Context: at rest and spontaneously   Context: not after intercourse and not genital trauma   Relieved by:  Nothing Worsened by:  Nothing tried Ineffective treatments:  None tried Associated symptoms: no abdominal pain and no fever   Risk factors: no hx of ectopic pregnancy and no STD   G3P1011 at 10 weeks 2 days by [place] and LMP.  Patient does not believe she received Rhogam during her last pregnancy.  She has a procedure at 14 for Left ectopic with lysis of adhesions to the sigmoid colon  Past Medical History  Diagnosis Date  . Hypertension   . Ectopic pregnancy without intrauterine pregnancy    History reviewed. No pertinent past surgical history. History reviewed. No pertinent family history. Social History  Substance Use Topics  . Smoking status: Never Smoker   . Smokeless tobacco: None  . Alcohol Use: No   OB History    Gravida Para Term Preterm AB TAB SAB Ectopic Multiple Living   1              Review of Systems  Constitutional: Negative for fever.  Gastrointestinal: Negative for abdominal pain.  Genitourinary: Positive for vaginal bleeding.  All other systems reviewed and are negative.     Allergies  Review of patient's allergies indicates no known allergies.  Home Medications   Prior to Admission medications   Medication Sig Start Date End Date Taking? Authorizing Provider  hydrochlorothiazide  (HYDRODIURIL) 25 MG tablet Take 25 mg by mouth daily.    Historical Provider, MD  metroNIDAZOLE (FLAGYL) 500 MG tablet Take 1 tablet (500 mg total) by mouth 2 (two) times daily. 05/15/15   Azalia Bilis, MD   BP 107/77 mmHg  Pulse 99  Temp(Src) 98.6 F (37 C) (Oral)  Resp 18  Ht  (1.6 m)  Wt 322 lb (146.058 kg)  BMI 57.05 kg/m2  SpO2 100% Physical Exam  Constitutional: She is oriented to person, place, and time. She appears well-developed and well-nourished. No distress.  HENT:  Head: Normocephalic and atraumatic.  Mouth/Throat: Oropharynx is clear and moist.  Eyes: Conjunctivae are normal. Pupils are equal, round, and reactive to light.  Neck: Normal range of motion. Neck supple.  Cardiovascular: Normal rate, regular rhythm and intact distal pulses.   Pulmonary/Chest: Effort normal and breath sounds normal. No respiratory distress. She has no wheezes. She has no rales.  Abdominal: Soft. Bowel sounds are normal. There is no tenderness. There is no rebound.  Genitourinary: There is bleeding in the vagina.  Chaperone present, os closed evacuated one clot then scant discharge  Musculoskeletal: Normal range of motion.  Neurological: She is alert and oriented to person, place, and time.  Skin: Skin is warm and dry.  Psychiatric:  tearful    ED Course  Procedures (including critical care time) Labs Review Labs Reviewed  URINALYSIS, ROUTINE W REFLEX MICROSCOPIC (NOT AT [facility]) - Abnormal; Notable for the following:    Color, Urine  RED (*)    APPearance CLOUDY (*)    Hgb urine dipstick LARGE (*)    Ketones, ur 15 (*)    Protein, ur 30 (*)    Leukocytes, UA SMALL (*)    All other components within normal limits  PREGNANCY, URINE - Abnormal; Notable for the following:    Preg Test, Ur POSITIVE (*)    All other components within normal limits  URINE MICROSCOPIC-ADD ON - Abnormal; Notable for the following:    Squamous Epithelial / LPF 0-5 (*)    Bacteria, UA RARE (*)    All  other components within normal limits  CBC WITH DIFFERENTIAL/PLATELET - Abnormal; Notable for the following:    WBC 11.0 (*)    Hemoglobin 11.6 (*)    HCT 34.6 (*)    All other components within normal limits  WET PREP, GENITAL  BASIC METABOLIC PANEL  ABO/RH  GC/CHLAMYDIA PROBE AMP ([place]) NOT AT [facility]    Imaging Review No results found. I have personally reviewed and evaluated these images and lab results as part of my medical decision-making.   EKG Interpretation None      MDM   Final diagnoses:  None   Filed Vitals:   06/04/15 0329  BP: 107/77  Pulse: 99  Temp: 98.6 F (37 C)  Resp: 18   Results for orders placed or performed during the hospital encounter of 06/04/15  Wet prep, genital  Result Value Ref Range   Yeast Wet Prep HPF POC NONE SEEN NONE SEEN   Trich, Wet Prep NONE SEEN NONE SEEN   Clue Cells Wet Prep HPF POC NONE SEEN NONE SEEN   WBC, Wet Prep HPF POC NONE SEEN NONE SEEN   Sperm NONE SEEN   Urinalysis, Routine w reflex microscopic (not at [facility])  Result Value Ref Range   Color, Urine RED (A) YELLOW   APPearance CLOUDY (A) CLEAR   Specific Gravity, Urine 1.023 1.005 - 1.030   pH 6.0 5.0 - 8.0   Glucose, UA NEGATIVE NEGATIVE mg/dL   Hgb urine dipstick LARGE (A) NEGATIVE   Bilirubin Urine NEGATIVE NEGATIVE   Ketones, ur 15 (A) NEGATIVE mg/dL   Protein, ur 30 (A) NEGATIVE mg/dL   Nitrite NEGATIVE NEGATIVE   Leukocytes, UA SMALL (A) NEGATIVE  Pregnancy, urine  Result Value Ref Range   Preg Test, Ur POSITIVE (A) NEGATIVE  Urine microscopic-add on  Result Value Ref Range   Squamous Epithelial / LPF 0-5 (A) NONE SEEN   WBC, UA 0-5 0 - 5 WBC/hpf   RBC / HPF TOO NUMEROUS TO COUNT 0 - 5 RBC/hpf   Bacteria, UA RARE (A) NONE SEEN   Urine-Other MUCOUS PRESENT   CBC with Differential/Platelet  Result Value Ref Range   WBC 11.0 (H) 4.0 - 10.5 K/uL   RBC 4.30 3.87 - 5.11 MIL/uL   Hemoglobin 11.6 (L) 12.0 - 15.0 g/dL   HCT 16.134.6 (L) 09.636.0 - 04.546.0 %    MCV 80.5 78.0 - 100.0 fL   MCH 27.0 26.0 - 34.0 pg   MCHC 33.5 30.0 - 36.0 g/dL   RDW 40.914.9 81.111.5 - 91.415.5 %   Platelets 372 150 - 400 K/uL   Neutrophils Relative % 64 %   Neutro Abs 6.9 1.7 - 7.7 K/uL   Lymphocytes Relative 28 %   Lymphs Abs 3.1 0.7 - 4.0 K/uL   Monocytes Relative 7 %   Monocytes Absolute 0.8 0.1 - 1.0 K/uL   Eosinophils Relative 1 %  Eosinophils Absolute 0.1 0.0 - 0.7 K/uL   Basophils Relative 0 %   Basophils Absolute 0.0 0.0 - 0.1 K/uL   No results found.  Medications  potassium chloride SA (K-DUR,KLOR-CON) CR tablet 40 mEq (40 mEq Oral Given 06/04/15 0518)    ABO RH sent--- it is a send out and will take several hours to return  Case d/w Lurena Joiner midwife for [facility] on 05/12/15 patient had a confirmed IUP at 7 weeks.  Pelvic rest and follow up in the office.  If bleeding persists follow up at [facility].   Patient is aware that it will take several hours for this test to return.  She will need to have [facility] follow up on her blood type and if she is RH- will need Rhogam.  She will ask during her appointment.  We will print this information on her discharge paperwork  She understands that she is to maintain strict pelvic rest and drink lots of fluids and to avoid stress.  She understands that if symptoms worsen she should present to [facility] and that she must call her OB to be seen today.     Stable for discharge call office in the am to be seen.      Cy Blamer, MD 06/04/15 [phone]

## 2015-06-04 NOTE — ED Notes (Signed)
Pt received written and verbal instructions to call to see her md today and also to ask about rh factor

## 2016-01-17 ENCOUNTER — Encounter (HOSPITAL_BASED_OUTPATIENT_CLINIC_OR_DEPARTMENT_OTHER): Payer: Self-pay | Admitting: *Deleted

## 2016-01-17 ENCOUNTER — Emergency Department (HOSPITAL_BASED_OUTPATIENT_CLINIC_OR_DEPARTMENT_OTHER)
Admission: EM | Admit: 2016-01-17 | Discharge: 2016-01-17 | Disposition: A | Discharge status: Home / Self Care | Payer: Medicaid Other | Attending: Emergency Medicine | Admitting: Emergency Medicine

## 2016-01-17 DIAGNOSIS — I10 Essential (primary) hypertension: Secondary | ICD-10-CM | POA: Insufficient documentation

## 2016-01-17 DIAGNOSIS — Y929 Unspecified place or not applicable: Secondary | ICD-10-CM | POA: Insufficient documentation

## 2016-01-17 DIAGNOSIS — Y9389 Activity, other specified: Secondary | ICD-10-CM | POA: Insufficient documentation

## 2016-01-17 DIAGNOSIS — Z79899 Other long term (current) drug therapy: Secondary | ICD-10-CM | POA: Insufficient documentation

## 2016-01-17 DIAGNOSIS — Y990 Civilian activity done for income or pay: Secondary | ICD-10-CM | POA: Diagnosis not present

## 2016-01-17 DIAGNOSIS — G5601 Carpal tunnel syndrome, right upper limb: Secondary | ICD-10-CM

## 2016-01-17 DIAGNOSIS — S6991XA Unspecified injury of right wrist, hand and finger(s), initial encounter: Secondary | ICD-10-CM | POA: Diagnosis present

## 2016-01-17 DIAGNOSIS — X503XXA Overexertion from repetitive movements, initial encounter: Secondary | ICD-10-CM | POA: Diagnosis not present

## 2016-01-17 DIAGNOSIS — M79641 Pain in right hand: Secondary | ICD-10-CM

## 2016-01-17 MED ORDER — PREDNISONE 10 MG PO TABS: 20.0000 mg | ORAL_TABLET | Freq: Two times a day (BID) | ORAL | 0 refills | Status: DC

## 2016-01-17 MED ORDER — TRAMADOL HCL 50 MG PO TABS: 50.0000 mg | ORAL_TABLET | Freq: Four times a day (QID) | ORAL | 0 refills | Status: DC | PRN

## 2016-01-17 NOTE — ED Provider Notes (Signed)
[facility]   CSN: [id number] Arrival date & time: 01/17/16  [id number]     History   Chief Complaint Chief Complaint  Patient presents with  . Hand Pain    HPI Vicki Lawrence is a 40 y.o. female.  Patient is a 40 year old female with no significant past medical history. She presents for evaluation of right hand pain. This has been worsening over the past several days. She denies any specific injury or trauma, but does report performing repetitive motions at work. She denies any weakness but does report some numbness and tingling. She denies any significant shoulder, neck, or elbow pain.   The history is provided by the patient.  Hand Pain  This is a new problem. Episode onset: Several days ago. The problem occurs constantly. The problem has been gradually worsening. Exacerbated by: Movement and palpation. Nothing relieves the symptoms.    Past Medical History:  Diagnosis Date  . Ectopic pregnancy without intrauterine pregnancy   . Hypertension     There are no active problems to display for this patient.   History reviewed. No pertinent surgical history.  OB History    Gravida Para Term Preterm AB Living   1             SAB TAB Ectopic Multiple Live Births                   Home Medications    Prior to Admission medications   Medication Sig Start Date End Date Taking? Authorizing Provider  hydrochlorothiazide (HYDRODIURIL) 25 MG tablet Take 25 mg by mouth daily.    Historical Provider, MD  metroNIDAZOLE (FLAGYL) 500 MG tablet Take 1 tablet (500 mg total) by mouth 2 (two) times daily. 05/15/15   Azalia BilisKevin Campos, MD    Family History No family history on file.  Social History Social History  Substance Use Topics  . Smoking status: Never Smoker  . Smokeless tobacco: Not on file  . Alcohol use No     Allergies   Patient has no known allergies.   Review of Systems Review of Systems  All other systems reviewed and are  negative.    Physical Exam Updated Vital Signs BP 147/92 (BP Location: Right Arm)   Pulse 79   Temp 98.3 F (36.8 C)   Resp 19   SpO2 100%   Physical Exam  Constitutional: She is oriented to person, place, and time. She appears well-developed and well-nourished.  HENT:  Head: Normocephalic and atraumatic.  Neck: Normal range of motion. Neck supple.  Musculoskeletal:  The right hand and wrist appear grossly normal. There is some tenderness over the volar aspect of the wrist. She is able to flex, extend, and oppose all fingers. Strength is normal. Sensation is intact throughout the entire hand.  Neurological: She is alert and oriented to person, place, and time.  Skin: Skin is warm and dry.  Nursing note and vitals reviewed.    ED Treatments / Results  Labs (all labs ordered are listed, but only abnormal results are displayed) Labs Reviewed - No data to display  EKG  EKG Interpretation None       Radiology No results found.  Procedures Procedures (including critical care time)  Medications Ordered in ED Medications - No data to display   Initial Impression / Assessment and Plan / ED Course  I have reviewed the triage vital signs and the nursing notes.  Pertinent labs & imaging results that were available  during my care of the patient were reviewed by me and considered in my medical decision making (see chart for details).  Clinical Course     Patient's complaints and exam are most consistent with a carpal tunnel syndrome. She will be treated with a wrist splint, prednisone, tramadol, and when necessary follow-up with her primary Dr. if her symptoms are not improving.  Final Clinical Impressions(s) / ED Diagnoses   Final diagnoses:  None    New Prescriptions New Prescriptions   No medications on file     Geoffery Lyonsouglas Ronniesha Seibold, MD 01/17/16 [phone]

## 2016-01-17 NOTE — Discharge Instructions (Signed)
Wear wrist splint as applied as much as possible for the next several days.  Prednisone as prescribed. Tramadol as prescribed as needed for pain.  Follow-up with your primary Dr. if your symptoms are not improving in the next week.

## 2016-01-17 NOTE — ED Notes (Signed)
Dr. Judd Lienelo in room with patient now.

## 2017-06-13 ENCOUNTER — Emergency Department (HOSPITAL_BASED_OUTPATIENT_CLINIC_OR_DEPARTMENT_OTHER)
Admission: EM | Admit: 2017-06-13 | Discharge: 2017-06-13 | Disposition: A | Discharge status: Home / Self Care | Payer: 59 | Attending: Emergency Medicine | Admitting: Emergency Medicine

## 2017-06-13 ENCOUNTER — Emergency Department (HOSPITAL_BASED_OUTPATIENT_CLINIC_OR_DEPARTMENT_OTHER): Payer: 59

## 2017-06-13 ENCOUNTER — Other Ambulatory Visit: Payer: Self-pay

## 2017-06-13 ENCOUNTER — Encounter (HOSPITAL_BASED_OUTPATIENT_CLINIC_OR_DEPARTMENT_OTHER): Payer: Self-pay | Admitting: *Deleted

## 2017-06-13 DIAGNOSIS — O209 Hemorrhage in early pregnancy, unspecified: Secondary | ICD-10-CM

## 2017-06-13 DIAGNOSIS — O200 Threatened abortion: Secondary | ICD-10-CM | POA: Insufficient documentation

## 2017-06-13 DIAGNOSIS — O10011 Pre-existing essential hypertension complicating pregnancy, first trimester: Secondary | ICD-10-CM | POA: Insufficient documentation

## 2017-06-13 DIAGNOSIS — Z3A01 Less than 8 weeks gestation of pregnancy: Secondary | ICD-10-CM | POA: Diagnosis not present

## 2017-06-13 DIAGNOSIS — N939 Abnormal uterine and vaginal bleeding, unspecified: Secondary | ICD-10-CM

## 2017-06-13 DIAGNOSIS — Z79899 Other long term (current) drug therapy: Secondary | ICD-10-CM | POA: Insufficient documentation

## 2017-06-13 DIAGNOSIS — O23599 Infection of other part of genital tract in pregnancy, unspecified trimester: Secondary | ICD-10-CM | POA: Insufficient documentation

## 2017-06-13 DIAGNOSIS — F1590 Other stimulant use, unspecified, uncomplicated: Secondary | ICD-10-CM | POA: Insufficient documentation

## 2017-06-13 DIAGNOSIS — N760 Acute vaginitis: Secondary | ICD-10-CM

## 2017-06-13 DIAGNOSIS — O208 Other hemorrhage in early pregnancy: Secondary | ICD-10-CM

## 2017-06-13 DIAGNOSIS — B9689 Other specified bacterial agents as the cause of diseases classified elsewhere: Secondary | ICD-10-CM

## 2017-06-13 DIAGNOSIS — O99321 Drug use complicating pregnancy, first trimester: Secondary | ICD-10-CM | POA: Insufficient documentation

## 2017-06-13 LAB — URINALYSIS, MICROSCOPIC (REFLEX)

## 2017-06-13 LAB — URINALYSIS, ROUTINE W REFLEX MICROSCOPIC
Bilirubin Urine: NEGATIVE
Glucose, UA: NEGATIVE mg/dL
KETONES UR: NEGATIVE mg/dL
LEUKOCYTES UA: NEGATIVE
NITRITE: NEGATIVE
PH: 6 (ref 5.0–8.0)
PROTEIN: NEGATIVE mg/dL

## 2017-06-13 LAB — BASIC METABOLIC PANEL
Anion gap: 9 (ref 5–15)
BUN: 11 mg/dL (ref 6–20)
CALCIUM: 8.8 mg/dL — AB (ref 8.9–10.3)
CHLORIDE: 107 mmol/L (ref 101–111)
CO2: 21 mmol/L — AB (ref 22–32)
CREATININE: 0.86 mg/dL (ref 0.44–1.00)
GFR calc Af Amer: >60 mL/min (ref >60)
GFR calc non Af Amer: >60 mL/min (ref >60)
Glucose, Bld: 97 mg/dL (ref 65–99)
Potassium: 3.1 mmol/L — ABNORMAL LOW (ref 3.5–5.1)
SODIUM: 137 mmol/L (ref 135–145)

## 2017-06-13 LAB — PREGNANCY, URINE: Preg Test, Ur: POSITIVE — AB

## 2017-06-13 LAB — CBC WITH DIFFERENTIAL/PLATELET
BASOS PCT: 0 %
Basophils Absolute: 0 10*3/uL (ref 0.0–0.1)
EOS ABS: 0.3 10*3/uL (ref 0.0–0.7)
Eosinophils Relative: 2 %
HCT: 33.8 % — ABNORMAL LOW (ref 36.0–46.0)
HEMOGLOBIN: 11.6 g/dL — AB (ref 12.0–15.0)
LYMPHS ABS: 5.1 10*3/uL — AB (ref 0.7–4.0)
Lymphocytes Relative: 36 %
MCH: 25.6 pg — AB (ref 26.0–34.0)
MCHC: 34.3 g/dL (ref 30.0–36.0)
MCV: 74.6 fL — ABNORMAL LOW (ref 78.0–100.0)
MONO ABS: 0.9 10*3/uL (ref 0.1–1.0)
Monocytes Relative: 6 %
NEUTROS PCT: 56 %
Neutro Abs: 7.8 10*3/uL — ABNORMAL HIGH (ref 1.7–7.7)
Platelets: 479 10*3/uL — ABNORMAL HIGH (ref 150–400)
RBC: 4.53 MIL/uL (ref 3.87–5.11)
RDW: 15.7 % — AB (ref 11.5–15.5)
WBC: 14.1 10*3/uL — ABNORMAL HIGH (ref 4.0–10.5)

## 2017-06-13 LAB — WET PREP, GENITAL
SPERM: NONE SEEN
Trich, Wet Prep: NONE SEEN
Yeast Wet Prep HPF POC: NONE SEEN

## 2017-06-13 LAB — HCG, QUANTITATIVE, PREGNANCY: hCG, Beta Chain, Quant, S: 1159 m[IU]/mL — ABNORMAL HIGH (ref <5)

## 2017-06-13 MED ORDER — METRONIDAZOLE 500 MG PO TABS: 500.0000 mg | ORAL_TABLET | Freq: Two times a day (BID) | ORAL | 0 refills | Status: AC

## 2017-06-13 NOTE — ED Notes (Signed)
Pt. Is in [place] at this time.. RN in to start IV and draw blood work but pt. Not in room.

## 2017-06-13 NOTE — ED Triage Notes (Signed)
She had a positive pregnancy test a week ago. She started having vaginal bleeding this am. She is tearful at triage.

## 2017-06-13 NOTE — ED Provider Notes (Signed)
[facility] EMERGENCY DEPARTMENT Provider Note  CSN: [id number] Arrival date & time: 06/13/17 1442  Chief Complaint(s) Vaginal Bleeding  HPI Vicki Lawrence is a 42 y.o. female   The history is provided by the patient.  Vaginal Bleeding  Primary symptoms include vaginal bleeding. There has been no fever. This is a new problem. The problem has been resolved. She is pregnant. She has missed her period. LMP: March 24. Associated symptoms include abdominal pain (pelvic pressure). Pertinent negatives include no abdominal swelling, no constipation, no diarrhea, no nausea, no vomiting, no light-headedness and no dizziness. She has tried nothing for the symptoms. Associated medical issues include ectopic pregnancy. Associated medical issues comments: missed carriage 2 yrs ago.    Past Medical History Past Medical History:  Diagnosis Date  . Ectopic pregnancy without intrauterine pregnancy   . Hypertension    There are no active problems to display for this patient.  Home Medication(s) Prior to Admission medications   Medication Sig Start Date End Date Taking? Authorizing Provider  hydrochlorothiazide (HYDRODIURIL) 25 MG tablet Take 25 mg by mouth daily.    [provider]  metroNIDAZOLE (FLAGYL) 500 MG tablet Take 1 tablet (500 mg total) by mouth 2 (two) times daily for 7 days. 06/13/17 06/20/17  Nira Conn, MD  predniSONE (DELTASONE) 10 MG tablet Take 2 tablets (20 mg total) by mouth 2 (two) times daily. 01/17/16   Geoffery Lyons, MD  traMADol (ULTRAM) 50 MG tablet Take 1 tablet (50 mg total) by mouth every 6 (six) hours as needed. 01/17/16   Geoffery Lyons, MD                                                                                                                                    Past Surgical History History reviewed. No pertinent surgical history. Family History No family history on file.  Social History Social History   Tobacco Use  .  Smoking status: Never Smoker  . Smokeless tobacco: Never Used  Substance Use Topics  . Alcohol use: No  . Drug use: No   Allergies Patient has no known allergies.  Review of Systems Review of Systems  Gastrointestinal: Positive for abdominal pain (pelvic pressure). Negative for constipation, diarrhea, nausea and vomiting.  Genitourinary: Positive for vaginal bleeding.  Neurological: Negative for dizziness and light-headedness.   All other systems are reviewed and are negative for acute change except as noted in the HPI  Physical Exam Vital Signs  I have reviewed the triage vital signs BP (!) 161/94   Pulse 75   Temp 97.9 F (36.6 C)   Resp 20   Ht  (1.6 m)   Wt (!) 145.2 kg (320 lb)   LMP 04/30/2017   SpO2 100%   BMI 56.69 kg/m   Physical Exam  Constitutional: She is oriented to person, place, and time. She appears well-developed and well-nourished. No distress.  HENT:  Head: Normocephalic and atraumatic.  Right Ear: External ear normal.  Left Ear: External ear normal.  Nose: Nose normal.  Eyes: Conjunctivae and EOM are normal. No scleral icterus.  Neck: Normal range of motion and phonation normal.  Cardiovascular: Normal rate and regular rhythm.  Pulmonary/Chest: Effort normal. No stridor. No respiratory distress.  Abdominal: She exhibits no distension. There is no tenderness. There is no rigidity, no rebound and no guarding.  Genitourinary: Pelvic exam was performed with patient supine. Cervix exhibits no motion tenderness, no discharge and no friability. Right adnexum displays no tenderness. Left adnexum displays no tenderness. No erythema or bleeding in the vagina. No foreign body in the vagina. Vaginal discharge (mild) found.  Genitourinary Comments: Chaperone present during pelvic exam.  Cervical office closed.    Musculoskeletal: Normal range of motion. She exhibits no edema.  Neurological: She is alert and oriented to person, place, and time.  Skin: She  is not diaphoretic.  Psychiatric: She has a normal mood and affect. Her behavior is normal.  Vitals reviewed.   ED Results and Treatments Labs (all labs ordered are listed, but only abnormal results are displayed) Labs Reviewed  WET PREP, GENITAL - Abnormal; Notable for the following components:      Result Value   Clue Cells Wet Prep HPF POC PRESENT (*)    WBC, Wet Prep HPF POC MANY (*)    All other components within normal limits  PREGNANCY, URINE - Abnormal; Notable for the following components:   Preg Test, Ur POSITIVE (*)    All other components within normal limits  URINALYSIS, ROUTINE W REFLEX MICROSCOPIC - Abnormal; Notable for the following components:   APPearance HAZY (*)    Specific Gravity, Urine >1.030 (*)    Hgb urine dipstick SMALL (*)    All other components within normal limits  URINALYSIS, MICROSCOPIC (REFLEX) - Abnormal; Notable for the following components:   Bacteria, UA MANY (*)    All other components within normal limits  CBC WITH DIFFERENTIAL/PLATELET - Abnormal; Notable for the following components:   WBC 14.1 (*)    Hemoglobin 11.6 (*)    HCT 33.8 (*)    MCV 74.6 (*)    MCH 25.6 (*)    RDW 15.7 (*)    Platelets 479 (*)    Neutro Abs 7.8 (*)    Lymphs Abs 5.1 (*)    All other components within normal limits  BASIC METABOLIC PANEL - Abnormal; Notable for the following components:   Potassium 3.1 (*)    CO2 21 (*)    Calcium 8.8 (*)    All other components within normal limits  HCG, QUANTITATIVE, PREGNANCY - Abnormal; Notable for the following components:   hCG, Beta Chain, Quant, S 1,159 (*)    All other components within normal limits  GC/CHLAMYDIA PROBE AMP ([place]) NOT AT [facility]  EKG  EKG Interpretation  Date/Time:    Ventricular Rate:    PR Interval:    QRS Duration:   QT Interval:    QTC Calculation:   R  Axis:     Text Interpretation:        Radiology US Ob Less Than 14 Weeks With Ob Transvaginal  Result Date: 06/13/2017 CLINICAL DATA:  Vaginal bleeding today. EXAM: OBSTETRIC <14 WK [place] AND TRANSVAGINAL OB TECHNIQUE: Both transabdominal and transvaginal ultrasound examinations were performed for complete evaluation of the gestation as well as the maternal uterus, adnexal regions, and pelvic cul-de-sac. Transvaginal technique was performed to assess early pregnancy. Color and duplex Doppler ultrasound was utilized to evaluate blood flow to the ovaries. COMPARISON:  None. FINDINGS: The patient's body habitus (320 pounds weight) results in technical difficulties, leading to diminished sensitivity and specificity. Intrauterine gestational sac: Present, single. Yolk sac:  Not visualized Embryo:  Not visualized Cardiac Activity: Not visualized Heart Rate: Not applicable bpm MSD: 5.0 mm   5 w   2 d Subchorionic hemorrhage:  None visualized. Maternal uterus/adnexae: Not well visualized. IMPRESSION: Suboptimal examination due to patient's body habitus. Intrauterine gestational sac of 5 mm, without visible yolk sac, or embryo. This could represent a 5 week 2 day pregnancy. Correlate clinically with quantitative beta HCG values. Electronically Signed   By: Elsie Stain M.D.   On: 06/13/2017 20:24   Pertinent labs & imaging results that were available during my care of the patient were reviewed by me and considered in my medical decision making (see chart for details).  Medications Ordered in ED Medications - No data to display                                                                                                                                  Procedures Procedures  (including critical care time)  Medical Decision Making / ED Course I have reviewed the nursing notes for this encounter and the patient's prior records (if available in EHR or on provided paperwork).    Vaginal bleeding in the  setting of positive pregnancy test.  Cervical office was closed on exam.  Patient did have some mild vaginal discharge and wet prep consistent with bacterial vaginosis.  Possible implantation bleed versus threatened abortion.  Ultrasound revealed intrauterine gestational sac without yolk sac.  Consistent with hCG quant.  Unable to rule out ectopic pregnancy.  On prior labs patient was noted to be O+ blood type and does not require RhoGam at this time.  All the labs grossly reassuring.  UA without evidence of infection.  We will treat bacterial vaginosis with Flagyl.  Patient recommended to follow-up with women's for quant trending.   The patient appears reasonably screened and/or stabilized for discharge and I doubt any other medical condition or other [facility] requiring further screening, evaluation, or treatment in the ED at this time prior to discharge.  The patient is safe  for discharge with strict return precautions.   Final Clinical Impression(s) / ED Diagnoses Final diagnoses:  Vaginal bleeding  Threatened abortion  Bacterial vaginosis   Disposition: Discharge  Condition: Good  I have discussed the results, Dx and Tx plan with the patient who expressed understanding and agree(s) with the plan. Discharge instructions discussed at great length. The patient was given strict return precautions who verbalized understanding of the instructions. No further questions at time of discharge.    ED Discharge Orders        Ordered    metroNIDAZOLE (FLAGYL) 500 MG tablet  2 times daily     06/13/17 2222       Follow Up: Kathlen Brunswick, MD [address] [place] [address] [phone]  Call  As needed  [facility] [address] [place] [address] [phone] In 2 days for repeat HCG Quant and to establish care with a provider for prenatal care      This chart was dictated using voice recognition software.  Despite best efforts to  proofread,  errors can occur which can change the documentation meaning.   Nira Conn, MD 06/13/17 [phone]

## 2017-06-14 LAB — GC/CHLAMYDIA PROBE AMP (~~LOC~~) NOT AT ARMC
CHLAMYDIA, DNA PROBE: NEGATIVE
NEISSERIA GONORRHEA: NEGATIVE

## 2017-06-16 ENCOUNTER — Other Ambulatory Visit: Payer: 59

## 2017-06-16 DIAGNOSIS — N912 Amenorrhea, unspecified: Secondary | ICD-10-CM

## 2017-06-16 NOTE — Progress Notes (Signed)
Patient presents for HCG redraw. Following up from emergency room. Will determine plan of care based off results. Armandina Stammer RN

## 2017-06-17 LAB — BETA HCG QUANT (REF LAB): hCG Quant: 1672 m[IU]/mL

## 2017-06-20 ENCOUNTER — Other Ambulatory Visit: Payer: 59

## 2017-06-20 DIAGNOSIS — N912 Amenorrhea, unspecified: Secondary | ICD-10-CM

## 2017-06-20 NOTE — Progress Notes (Signed)
Patient presents for repeat HCG.patient schedule to come in next week for follow up and plan of care. Armandina Stammer RN

## 2017-06-21 ENCOUNTER — Telehealth: Payer: Self-pay

## 2017-06-21 ENCOUNTER — Other Ambulatory Visit: Payer: Self-pay

## 2017-06-21 DIAGNOSIS — R799 Abnormal finding of blood chemistry, unspecified: Secondary | ICD-10-CM

## 2017-06-21 DIAGNOSIS — IMO0002 Reserved for concepts with insufficient information to code with codable children: Secondary | ICD-10-CM

## 2017-06-21 LAB — BETA HCG QUANT (REF LAB): hCG Quant: 2942 m[IU]/mL

## 2017-06-21 NOTE — Telephone Encounter (Signed)
Spoke with patient and made her aware that HCG did not really rise appropriately.   Patient made aware that Dr. Erin Fulling is recommending that she have a follow up ultrasound and then follow up in our office. Patient states understanding and we have her schedule for visit on 06-28-17. Patient given imaging number and will schedule her ultrasound before her appointment on 06-28-17. Armandina Stammer RN

## 2017-06-22 ENCOUNTER — Encounter (HOSPITAL_BASED_OUTPATIENT_CLINIC_OR_DEPARTMENT_OTHER): Payer: Self-pay

## 2017-06-22 ENCOUNTER — Ambulatory Visit (HOSPITAL_BASED_OUTPATIENT_CLINIC_OR_DEPARTMENT_OTHER)
Admission: RE | Admit: 2017-06-22 | Discharge: 2017-06-22 | Disposition: A | Discharge status: Home / Self Care | Payer: 59 | Source: Ambulatory Visit | Attending: Obstetrics & Gynecology | Admitting: Obstetrics & Gynecology

## 2017-06-22 DIAGNOSIS — O26891 Other specified pregnancy related conditions, first trimester: Secondary | ICD-10-CM | POA: Insufficient documentation

## 2017-06-22 DIAGNOSIS — Z3A01 Less than 8 weeks gestation of pregnancy: Secondary | ICD-10-CM | POA: Diagnosis not present

## 2017-06-22 DIAGNOSIS — R799 Abnormal finding of blood chemistry, unspecified: Secondary | ICD-10-CM | POA: Insufficient documentation

## 2017-06-22 DIAGNOSIS — O3411 Maternal care for benign tumor of corpus uteri, first trimester: Secondary | ICD-10-CM | POA: Diagnosis not present

## 2017-06-22 DIAGNOSIS — IMO0002 Reserved for concepts with insufficient information to code with codable children: Secondary | ICD-10-CM

## 2017-06-28 ENCOUNTER — Ambulatory Visit (INDEPENDENT_AMBULATORY_CARE_PROVIDER_SITE_OTHER): Payer: 59 | Admitting: Obstetrics & Gynecology

## 2017-06-28 ENCOUNTER — Encounter: Payer: Self-pay | Admitting: Obstetrics & Gynecology

## 2017-06-28 VITALS — BP 134/89 | HR 86 | Ht 63.0 in | Wt 318.0 lb

## 2017-06-28 DIAGNOSIS — O021 Missed abortion: Secondary | ICD-10-CM

## 2017-06-28 MED ORDER — IBUPROFEN 800 MG PO TABS: 800.0000 mg | ORAL_TABLET | Freq: Three times a day (TID) | ORAL | 3 refills | Status: DC | PRN

## 2017-06-28 MED ORDER — MISOPROSTOL 200 MCG PO TABS: ORAL_TABLET | ORAL | 0 refills | Status: DC

## 2017-06-28 NOTE — Progress Notes (Signed)
History:  42 y.o. [id number] here today for eval of possible SAB. LMP 04/30/2017 Pt took a OTC UPT and it was +. I week later she was seen in the ED for vaginal bleeding.  Pt reports an SAB in 2017 and is worried about having another SAB.   Pt reports breast tenderness. She denies bleeding or pain. Pt was not trying to get pregnant and is not sure that she wants to actually conceive. She is worried about the recurrent SABs.  She 'doesn't know if [she] can go through this again.   Prev [place] and qHCG results reviewed.   The following portions of the patient's history were reviewed and updated as appropriate: allergies, current medications, past family history, past medical history, past social history, past surgical history and problem list.  Review of Systems:  Pertinent items are noted in HPI.    Objective:  Physical Exam Blood pressure 134/89, pulse 86, height  (1.6 m), weight (!) 318 lb (144.2 kg), last menstrual period 04/30/2017.  CONSTITUTIONAL: Well-developed, well-nourished female in no acute distress.  HENT:  Normocephalic, atraumatic EYES: Conjunctivae and EOM are normal. No scleral icterus.  NECK: Normal range of motion SKIN: Skin is warm and dry. No rash noted. Not diaphoretic.No pallor. NEUROLGIC: Alert and oriented to person, place, and time. Normal coordination.  Abd: Soft, nontender and nondistended Pelvic: Normal appearing external genitalia; normal appearing vaginal mucosa and cervix.  Normal discharge.  Small uterus, no other palpable masses, no uterine or adnexal tenderness  Labs and Imaging US Ob Comp Less 14 Wks  Result Date: 06/22/2017 CLINICAL DATA:  Abnormal beta HCG possible miscarriage; spotting, cramping, history of prior ectopic pregnancy and LEFT salpingectomy, prior spontaneous abortion x 3, beta HCG 1159 on 06/13/2017, 1672 on 06/16/2017, [id number] on 06/20/2017 EXAM: OBSTETRIC <14 WK [place] AND TRANSVAGINAL OB US TECHNIQUE: Both transabdominal and transvaginal  ultrasound examinations were performed for complete evaluation of the gestation as well as the maternal uterus, adnexal regions, and pelvic cul-de-sac. Transvaginal technique was performed to assess early pregnancy. COMPARISON:  06/13/2017 FINDINGS: Intrauterine gestational sac: Single, located at mid uterus Yolk sac:  Not visualized Embryo:  Not visualized Cardiac Activity: N/A Heart Rate: N/A  bpm MSD: 8  mm   5 w   4  d Subchorionic hemorrhage:  None Maternal uterus/adnexae: Small uterine leiomyomata largest 17 x 16 x 19 mm. RIGHT ovary normal size and morphology 3.9 x 2.5 x 3.0 cm. LEFT ovary normal size and morphology, 3.2 x 4.0 x 2.8 cm. Tiny RIGHT paraovarian cyst 11 mm diameter. Trace free pelvic fluid. No other adnexal masses. IMPRESSION: Gestational sac within the mid uterus with mean sac diameter corresponding to 5 weeks 4 days EGA. No fetal pole identified to establish viability. Small uterine leiomyomata and probable tiny RIGHT paraovarian cyst 11 mm diameter. Electronically Signed   By: Ulyses Southward M.D.   On: 06/22/2017 11:05   US Ob Transvaginal  Result Date: 06/22/2017 CLINICAL DATA:  Abnormal beta HCG possible miscarriage; spotting, cramping, history of prior ectopic pregnancy and LEFT salpingectomy, prior spontaneous abortion x 3, beta HCG 1159 on 06/13/2017, 1672 on 06/16/2017, [id number] on 06/20/2017 EXAM: OBSTETRIC <14 WK [place] AND TRANSVAGINAL OB US TECHNIQUE: Both transabdominal and transvaginal ultrasound examinations were performed for complete evaluation of the gestation as well as the maternal uterus, adnexal regions, and pelvic cul-de-sac. Transvaginal technique was performed to assess early pregnancy. COMPARISON:  06/13/2017 FINDINGS: Intrauterine gestational sac: Single, located at mid uterus Yolk sac:  Not  visualized Embryo:  Not visualized Cardiac Activity: N/A Heart Rate: N/A  bpm MSD: 8  mm   5 w   4  d Subchorionic hemorrhage:  None Maternal uterus/adnexae: Small uterine leiomyomata  largest 17 x 16 x 19 mm. RIGHT ovary normal size and morphology 3.9 x 2.5 x 3.0 cm. LEFT ovary normal size and morphology, 3.2 x 4.0 x 2.8 cm. Tiny RIGHT paraovarian cyst 11 mm diameter. Trace free pelvic fluid. No other adnexal masses. IMPRESSION: Gestational sac within the mid uterus with mean sac diameter corresponding to 5 weeks 4 days EGA. No fetal pole identified to establish viability. Small uterine leiomyomata and probable tiny RIGHT paraovarian cyst 11 mm diameter. Electronically Signed   By: Ulyses Southward M.D.   On: 06/22/2017 11:05   US Ob Less Than 14 Weeks With Ob Transvaginal  Result Date: 06/13/2017 CLINICAL DATA:  Vaginal bleeding today. EXAM: OBSTETRIC <14 WK [place] AND TRANSVAGINAL OB TECHNIQUE: Both transabdominal and transvaginal ultrasound examinations were performed for complete evaluation of the gestation as well as the maternal uterus, adnexal regions, and pelvic cul-de-sac. Transvaginal technique was performed to assess early pregnancy. Color and duplex Doppler ultrasound was utilized to evaluate blood flow to the ovaries. COMPARISON:  None. FINDINGS: The patient's body habitus (320 pounds weight) results in technical difficulties, leading to diminished sensitivity and specificity. Intrauterine gestational sac: Present, single. Yolk sac:  Not visualized Embryo:  Not visualized Cardiac Activity: Not visualized Heart Rate: Not applicable bpm MSD: 5.0 mm   5 w   2 d Subchorionic hemorrhage:  None visualized. Maternal uterus/adnexae: Not well visualized. IMPRESSION: Suboptimal examination due to patient's body habitus. Intrauterine gestational sac of 5 mm, without visible yolk sac, or embryo. This could represent a 5 week 2 day pregnancy. Correlate clinically with quantitative beta HCG values. Electronically Signed   By: Elsie Stain M.D.   On: 06/13/2017 20:24   06/28/2017 Ofc [place]. GS 5 weeks   hCG Quant mIU/mL 2,942  1,672 CM    Assessment & Plan:  Missed abortion- Pt consoled re MAB.  Discussed potential causes of MAB/ SAB. Pt given options of surgical vs medical management. She opts for medical management  Cytotec per vagina q 8 hours x 2    Recurrent SAB- tp would like to f/u for eval. She is not sure if she wants referral to REI at present.   Labs next visit:  Anticardiolipin AB, TSH, FSH, Antimulerrien ab; PT/PTT, HgbA1c  F/u ion 2 weeks or sooner prn  Total face-to-face time with patient was 30 min.  Greater than 50% was spent in counseling andcoordination of care with the patient.   Breanah Faddis L. Harraway-Smith, M.D., [facility]

## 2017-06-28 NOTE — Patient Instructions (Signed)

## 2017-06-28 NOTE — Progress Notes (Signed)
Patient denies any bleeding. Patient following up from serial HCg and ultrasounds.  Armandina Stammer RN

## 2017-07-04 ENCOUNTER — Telehealth: Payer: Self-pay

## 2017-07-04 NOTE — Telephone Encounter (Signed)
Pt was prescribed Cytotec for SAB. Pt was concerned with the bleeding and pain. I explained to pt that the Cytotec is helping her body remove all retained products.Pt states that she bled more over the weekend but is doing better today. Pt advised to take ibuprofen as prescribed for the pain. Understanding was voiced.

## 2017-07-12 ENCOUNTER — Encounter: Payer: Self-pay | Admitting: Obstetrics & Gynecology

## 2017-07-12 ENCOUNTER — Ambulatory Visit: Payer: 59 | Admitting: Obstetrics & Gynecology

## 2017-07-12 ENCOUNTER — Ambulatory Visit (INDEPENDENT_AMBULATORY_CARE_PROVIDER_SITE_OTHER): Payer: 59 | Admitting: Obstetrics & Gynecology

## 2017-07-12 VITALS — BP 138/86 | HR 85 | Ht 63.0 in | Wt 316.0 lb

## 2017-07-12 DIAGNOSIS — O021 Missed abortion: Secondary | ICD-10-CM | POA: Diagnosis not present

## 2017-07-12 DIAGNOSIS — I10 Essential (primary) hypertension: Secondary | ICD-10-CM | POA: Diagnosis not present

## 2017-07-12 MED ORDER — HYDROCHLOROTHIAZIDE 25 MG PO TABS: 25.0000 mg | ORAL_TABLET | Freq: Every day | ORAL | 1 refills | Status: DC

## 2017-07-12 NOTE — Progress Notes (Signed)
History:  42 y.o. [id number] here today for f/u of SAB. She took the Cytotec. She took 2 doses of the Cytotec and noted the bleeding after the second dose. Pt denies pain or bleeding. She does c/o a HA for 2 days.  Pt feels better now and is without pain. She is not interested in conceiving but, she does not want to take contraception.    The following portions of the patient's history were reviewed and updated as appropriate: allergies, current medications, past family history, past medical history, past social history, past surgical history and problem list.  Review of Systems:  Pertinent items are noted in HPI.    Objective:  Physical Exam Blood pressure 138/86, pulse 85, height 5\' 3"  (1.6 m), weight (!) 316 lb (143.3 kg), last menstrual period 04/30/2017.  CONSTITUTIONAL: Well-developed, well-nourished female in no acute distress.  HENT:  Normocephalic, atraumatic EYES: Conjunctivae and EOM are normal. No scleral icterus.  NECK: Normal range of motion SKIN: Skin is warm and dry. No rash noted. Not diaphoretic.No pallor. NEUROLGIC: Alert and oriented to person, place, and time. Normal coordination.   Labs and Imaging [place] Ob Comp Less 14 Wks  Result Date: 06/22/2017 CLINICAL DATA:  Abnormal beta HCG possible miscarriage; spotting, cramping, history of prior ectopic pregnancy and LEFT salpingectomy, prior spontaneous abortion x 3, beta HCG 1159 on 06/13/2017, 1672 on 06/16/2017, [id number] on 06/20/2017 EXAM: OBSTETRIC <14 WK US AND TRANSVAGINAL OB US TECHNIQUE: Both transabdominal and transvaginal ultrasound examinations were performed for complete evaluation of the gestation as well as the maternal uterus, adnexal regions, and pelvic cul-de-sac. Transvaginal technique was performed to assess early pregnancy. COMPARISON:  06/13/2017 FINDINGS: Intrauterine gestational sac: Single, located at mid uterus Yolk sac:  Not visualized Embryo:  Not visualized Cardiac Activity: N/A Heart Rate: N/A  bpm MSD: 8   mm   5 w   4  d Subchorionic hemorrhage:  None Maternal uterus/adnexae: Small uterine leiomyomata largest 17 x 16 x 19 mm. RIGHT ovary normal size and morphology 3.9 x 2.5 x 3.0 cm. LEFT ovary normal size and morphology, 3.2 x 4.0 x 2.8 cm. Tiny RIGHT paraovarian cyst 11 mm diameter. Trace free pelvic fluid. No other adnexal masses. IMPRESSION: Gestational sac within the mid uterus with mean sac diameter corresponding to 5 weeks 4 days EGA. No fetal pole identified to establish viability. Small uterine leiomyomata and probable tiny RIGHT paraovarian cyst 11 mm diameter. Electronically Signed   By: Ulyses SouthwardMark  Boles M.D.   On: 06/22/2017 11:05   [place] Ob Transvaginal  Result Date: 06/22/2017 CLINICAL DATA:  Abnormal beta HCG possible miscarriage; spotting, cramping, history of prior ectopic pregnancy and LEFT salpingectomy, prior spontaneous abortion x 3, beta HCG 1159 on 06/13/2017, 1672 on 06/16/2017, [id number] on 06/20/2017 EXAM: OBSTETRIC <14 WK US AND TRANSVAGINAL OB US TECHNIQUE: Both transabdominal and transvaginal ultrasound examinations were performed for complete evaluation of the gestation as well as the maternal uterus, adnexal regions, and pelvic cul-de-sac. Transvaginal technique was performed to assess early pregnancy. COMPARISON:  06/13/2017 FINDINGS: Intrauterine gestational sac: Single, located at mid uterus Yolk sac:  Not visualized Embryo:  Not visualized Cardiac Activity: N/A Heart Rate: N/A  bpm MSD: 8  mm   5 w   4  d Subchorionic hemorrhage:  None Maternal uterus/adnexae: Small uterine leiomyomata largest 17 x 16 x 19 mm. RIGHT ovary normal size and morphology 3.9 x 2.5 x 3.0 cm. LEFT ovary normal size and morphology, 3.2 x 4.0 x 2.8 cm. Tiny  RIGHT paraovarian cyst 11 mm diameter. Trace free pelvic fluid. No other adnexal masses. IMPRESSION: Gestational sac within the mid uterus with mean sac diameter corresponding to 5 weeks 4 days EGA. No fetal pole identified to establish viability. Small uterine  leiomyomata and probable tiny RIGHT paraovarian cyst 11 mm diameter. Electronically Signed   By: Ulyses Southward M.D.   On: 06/22/2017 11:05   US Ob Less Than 14 Weeks With Ob Transvaginal  Result Date: 06/13/2017 CLINICAL DATA:  Vaginal bleeding today. EXAM: OBSTETRIC <14 WK [place] AND TRANSVAGINAL OB TECHNIQUE: Both transabdominal and transvaginal ultrasound examinations were performed for complete evaluation of the gestation as well as the maternal uterus, adnexal regions, and pelvic cul-de-sac. Transvaginal technique was performed to assess early pregnancy. Color and duplex Doppler ultrasound was utilized to evaluate blood flow to the ovaries. COMPARISON:  None. FINDINGS: The patient's body habitus (320 pounds weight) results in technical difficulties, leading to diminished sensitivity and specificity. Intrauterine gestational sac: Present, single. Yolk sac:  Not visualized Embryo:  Not visualized Cardiac Activity: Not visualized Heart Rate: Not applicable bpm MSD: 5.0 mm   5 w   2 d Subchorionic hemorrhage:  None visualized. Maternal uterus/adnexae: Not well visualized. IMPRESSION: Suboptimal examination due to patient's body habitus. Intrauterine gestational sac of 5 mm, without visible yolk sac, or embryo. This could represent a 5 week 2 day pregnancy. Correlate clinically with quantitative beta HCG values. Electronically Signed   By: Elsie Stain M.D.   On: 06/13/2017 20:24    Assessment & Plan:  Missed abortion- s/p Cytotec  Contraception counseling- pt declines all options She will f/u for next available for annual GYN exam   Elevated BP. H/o benign essential HTN  HCTZ 25 mg 1 po q day refilled  Refer to primary care.    Total face-to-face time with patient was 15 min.  Greater than 50% was spent in counseling and coordination of care with the patient.   Youcef Klas L. Harraway-Smith, M.D., [place] [place]

## 2017-07-12 NOTE — Progress Notes (Signed)
BP in left arm 154/93 Rt arm and 138/86. Pt states that she has been having headaches x2 days.

## 2017-07-13 ENCOUNTER — Encounter: Payer: Self-pay | Admitting: Obstetrics & Gynecology

## 2017-07-13 LAB — BETA HCG QUANT (REF LAB): hCG Quant: 7 m[IU]/mL

## 2017-07-27 ENCOUNTER — Ambulatory Visit (INDEPENDENT_AMBULATORY_CARE_PROVIDER_SITE_OTHER): Payer: 59 | Admitting: Family Medicine

## 2017-07-27 ENCOUNTER — Encounter: Payer: Self-pay | Admitting: Family Medicine

## 2017-07-27 VITALS — BP 128/70 | HR 89 | Temp 98.7°F | Ht 63.0 in | Wt 317.4 lb

## 2017-07-27 DIAGNOSIS — F418 Other specified anxiety disorders: Secondary | ICD-10-CM | POA: Insufficient documentation

## 2017-07-27 DIAGNOSIS — I10 Essential (primary) hypertension: Secondary | ICD-10-CM | POA: Diagnosis not present

## 2017-07-27 DIAGNOSIS — J040 Acute laryngitis: Secondary | ICD-10-CM

## 2017-07-27 LAB — POCT RAPID STREP A (OFFICE): RAPID STREP A SCREEN: NEGATIVE

## 2017-07-27 NOTE — Progress Notes (Signed)
Pre visit review using our clinic review tool, if applicable. No additional management support is needed unless otherwise documented below in the visit note. 

## 2017-07-27 NOTE — Progress Notes (Signed)
Chief Complaint  Patient presents with  . New Patient (Initial Visit)       New Patient Visit SUBJECTIVE: HPI: Vicki Lawrence is an 42 y.o.female who is being seen for establishing care.   1 d lost her voice and has a sore throat. No other s/s's including cough, sob, fevers, ear pain, eye complaints, runny/stuffy nose. +sick contact at work. Has not tried anything at home so far.   Hypertension Patient presents for hypertension follow up. She does not monitor home blood pressures. She is compliant with medication- HCTZ 25 mg/d. Patient has these side effects of medication: none She is in process of cleaning up diet. Exercise: Started walking on treadmill, lifting weights  Pt recently had a miscarriage and has been dealing with some anxiety. This has been going on for around 3 weeks. She does not want to start a medication. She is not following with a counselor or psychologist. She has recently started exercising and feels better.  No Known Allergies  Past Medical History:  Diagnosis Date  . Ectopic pregnancy without intrauterine pregnancy   . Headache   . Hypertension   . Migraines    Past Surgical History:  Procedure Laterality Date  . ECTOPIC PREGNANCY SURGERY  1992   Left salpingectomy per patient   Family History  Problem Relation Age of Onset  . Cancer Paternal Grandfather        prostate  . Cancer Mother [id number]       leukemia  . Hypertension Mother    No Known Allergies  Current Outpatient Medications:  .  hydrochlorothiazide (HYDRODIURIL) 25 MG tablet, Take 1 tablet (25 mg total) by mouth daily., Disp: 30 tablet, Rfl: 1  Patient's last menstrual period was 04/30/2017.  ROS Cardiovascular: Denies chest pain  Respiratory: Denies dyspnea   OBJECTIVE: BP 128/70 (BP Location: Left Arm, Patient Position: Sitting, Cuff Size: Large)   Pulse 89   Temp 98.7 F (37.1 C) (Oral)   Ht 5\' 3"  (1.6 m)   Wt (!) 317 lb 6 oz (144 kg)   LMP 04/30/2017   SpO2 98%    BMI 56.22 kg/m   Constitutional: -  VS reviewed -  Well developed, well nourished, appears stated age -  No apparent distress  Psychiatric: -  Oriented to person, place, and time -  Memory intact -  Affect and mood normal -  Fluent conversation, good eye contact -  Judgment and insight age appropriate  Eye: -  Conjunctivae clear, no discharge -  Pupils symmetric, round, reactive to light  ENMT: -  MMM    Pharynx moist, no exudate, no erythema  Neck: -  No gross swelling, no palpable masses -  Thyroid midline, not enlarged, mobile, no palpable masses  Cardiovascular: -  RRR -  No LE edema  Respiratory: -  Normal respiratory effort, no accessory muscle use, no retraction -  Breath sounds equal, no wheezes, no ronchi, no crackles  Gastrointestinal: -  Bowel sounds normal -  No tenderness, no distention, no guarding, no masses  Skin: -  No significant lesion on inspection -  Warm and dry to palpation   ASSESSMENT/PLAN: Laryngitis - Plan: POCT rapid strep A  Essential hypertension  Situational anxiety  Patient instructed to sign release of records form from her previous PCP. Discussed that #1 is usually viral, but when bacterial is general strep. Testing neg, no further tx needed for this. Supportive care. Cont HCTZ, ck bp's at home. If elevated, will let us  know. Home coping mechs provided in avs. # for [facility] provided for counseling. Pt did not wish to start medication. Is in bereavement phase. Cont exercise. Patient should return in 3 mo to f/u, if doing well will see twice yearly. The patient voiced understanding and agreement to the plan.   Jilda Roche [place], DO 07/27/17  9:53 AM

## 2017-07-27 NOTE — Patient Instructions (Signed)
If your strep test is positive, we will call in antibiotics. If normal, try throat lozenges, air humidifiers and salt water gargles. Most causes of laryngitis are viral though and antibiotics are not helpful.  Check blood pressures at home around 3 times weekly. If well controlled like today, OK to space out checking at home more and more.   Keep up the good work with improving your diet.  Aim to do some physical exertion for 150 minutes per week. This is typically divided into 5 days per week, 30 minutes per day. The activity should be enough to get your heart rate up. Anything is better than nothing if you have time constraints.  Please consider counseling. Contact [phone] to schedule an appointment or inquire about cost/insurance coverage.  Coping skills Choose 5 that work for you:  Take a deep breath  Count to 20  Read a book  Do a puzzle  Meditate  Bake  Sing  Knit  Garden  Pray  Go outside  Call a friend  Listen to music  Take a walk  Color  Send a note  Take a bath  Watch a movie  Be alone in a quiet place  Pet an animal  Visit a friend  Journal  Exercise  Stretch   Let us know if you need anything.

## 2017-07-28 ENCOUNTER — Encounter: Payer: Self-pay | Admitting: Family Medicine

## 2017-07-28 DIAGNOSIS — I10 Essential (primary) hypertension: Secondary | ICD-10-CM | POA: Insufficient documentation

## 2017-07-28 DIAGNOSIS — G43909 Migraine, unspecified, not intractable, without status migrainosus: Secondary | ICD-10-CM | POA: Insufficient documentation

## 2017-09-07 ENCOUNTER — Ambulatory Visit: Payer: 59 | Admitting: Obstetrics & Gynecology

## 2017-09-28 ENCOUNTER — Ambulatory Visit: Payer: 59 | Admitting: Obstetrics & Gynecology

## 2017-10-27 ENCOUNTER — Ambulatory Visit: Payer: 59 | Admitting: Family Medicine

## 2017-10-30 ENCOUNTER — Ambulatory Visit: Payer: 59 | Admitting: Family Medicine

## 2017-10-30 ENCOUNTER — Encounter: Payer: Self-pay | Admitting: Family Medicine

## 2017-10-30 ENCOUNTER — Other Ambulatory Visit: Payer: Self-pay

## 2017-10-30 VITALS — BP 107/80 | HR 88 | Temp 98.3°F | Resp 16 | Ht 63.0 in | Wt 321.0 lb

## 2017-10-30 DIAGNOSIS — S46819A Strain of other muscles, fascia and tendons at shoulder and upper arm level, unspecified arm, initial encounter: Secondary | ICD-10-CM | POA: Diagnosis not present

## 2017-10-30 DIAGNOSIS — I10 Essential (primary) hypertension: Secondary | ICD-10-CM | POA: Diagnosis not present

## 2017-10-30 DIAGNOSIS — B078 Other viral warts: Secondary | ICD-10-CM

## 2017-10-30 NOTE — Patient Instructions (Signed)
Pare down the wart and use Compound W to help get rid of the wart. We can do that here or freeze it, but it is more economical for you to do it at home.  BP looks good today. Stop medicine. Check blood pressure at home. Check and wait 1 minute and check again. Write down readings and bring monitor to next appt.  Heat (pad or rice pillow in microwave) over affected area, 10-15 minutes twice daily.   OK to take Tylenol 1000 mg (2 extra strength tabs) or 975 mg (3 regular strength tabs) every 6 hours as needed.  Trapezius stretches/exercises Do exercises exactly as told by your health care provider and adjust them as directed. It is normal to feel mild stretching, pulling, tightness, or discomfort as you do these exercises, but you should stop right away if you feel sudden pain or your pain gets worse.  Stretching and range of motion exercises These exercises warm up your muscles and joints and improve the movement and flexibility of your shoulder. These exercises can also help to relieve pain, numbness, and tingling. If you are unable to do any of the following for any reason, do not further attempt to do it.   Exercise A: Flexion, standing    1. Stand and hold a broomstick, a cane, or a similar object. Place your hands a little more than shoulder-width apart on the object. Your left / right hand should be palm-up, and your other hand should be palm-down. 2. Push the stick to raise your left / right arm out to your side and then over your head. Use your other hand to help move the stick. Stop when you feel a stretch in your shoulder, or when you reach the angle that is recommended by your health care provider. ? Avoid shrugging your shoulder while you raise your arm. Keep your shoulder blade tucked down toward your spine. 3. Hold for 30 seconds. 4. Slowly return to the starting position. Repeat 2 times. Complete this exercise 3 times per week.  Exercise B: Abduction, supine    1. Lie on  your back and hold a broomstick, a cane, or a similar object. Place your hands a little more than shoulder-width apart on the object. Your left / right hand should be palm-up, and your other hand should be palm-down. 2. Push the stick to raise your left / right arm out to your side and then over your head. Use your other hand to help move the stick. Stop when you feel a stretch in your shoulder, or when you reach the angle that is recommended by your health care provider. ? Avoid shrugging your shoulder while you raise your arm. Keep your shoulder blade tucked down toward your spine. 3. Hold for 30 seconds. 4. Slowly return to the starting position. Repeat 2 times. Complete this exercise 3 times per week.  Exercise C: Flexion, active-assisted    1. Lie on your back. You may bend your knees for comfort. 2. Hold a broomstick, a cane, or a similar object. Place your hands about shoulder-width apart on the object. Your palms should face toward your feet. 3. Raise the stick and move your arms over your head and behind your head, toward the floor. Use your healthy arm to help your left / right arm move farther. Stop when you feel a gentle stretch in your shoulder, or when you reach the angle where your health care provider tells you to stop. 4. Hold for 30 seconds.  5. Slowly return to the starting position. Repeat 2 times. Complete this exercise 3 times per week.  Exercise D: External rotation and abduction    1. Stand in a door frame with one of your feet slightly in front of the other. This is called a staggered stance. 2. Choose one of the following positions as told by your health care provider: ? Place your hands and forearms on the door frame above your head. ? Place your hands and forearms on the door frame at the height of your head. ? Place your hands on the door frame at the height of your elbows. 3. Slowly move your weight onto your front foot until you feel a stretch across your  chest and in the front of your shoulders. Keep your head and chest upright and keep your abdominal muscles tight. 4. Hold for 30 seconds. 5. To release the stretch, shift your weight to your back foot. Repeat 2 times. Complete this stretch 3 times per week.  Strengthening exercises These exercises build strength and endurance in your shoulder. Endurance is the ability to use your muscles for a long time, even after your muscles get tired. Exercise E: Scapular depression and adduction  1. Sit on a stable chair. Support your arms in front of you with pillows, armrests, or a tabletop. Keep your elbows in line with the sides of your body. 2. Gently move your shoulder blades down toward your middle back. Relax the muscles on the tops of your shoulders and in the back of your neck. 3. Hold for 3 seconds. 4. Slowly release the tension and relax your muscles completely before doing this exercise again. Repeat for a total of 10 repetitions. 5. After you have practiced this exercise, try doing the exercise without the arm support. Then, try the exercise while standing instead of sitting. Repeat 2 times. Complete this exercise 3 times per week.  Exercise F: Shoulder abduction, isometric    1. Stand or sit about 4-6 inches (10-15 cm) from a wall with your left / right side facing the wall. 2. Bend your left / right elbow and gently press your elbow against the wall. 3. Increase the pressure slowly until you are pressing as hard as you can without shrugging your shoulder. 4. Hold for 3 seconds. 5. Slowly release the tension and relax your muscles completely. Repeat for a total of 10 repetitions. Repeat 2 times. Complete this exercise 3 times per week.  Exercise G: Shoulder flexion, isometric    1. Stand or sit about 4-6 inches (10-15 cm) away from a wall with your left / right side facing the wall. 2. Keep your left / right elbow straight and gently press the top of your fist against the wall.  Increase the pressure slowly until you are pressing as hard as you can without shrugging your shoulder. 3. Hold for 10-15 seconds. 4. Slowly release the tension and relax your muscles completely. Repeat for a total of 10 repetitions. Repeat 2 times. Complete this exercise 3 times per week.  Exercise H: Internal rotation    1. Sit in a stable chair without armrests, or stand. Secure an exercise band at your left / right side, at elbow height. 2. Place a soft object, such as a folded towel or a small pillow, under your left / right upper arm so your elbow is a few inches (about 8 cm) away from your side. 3. Hold the end of the exercise band so the band stretches. 4.  Keeping your elbow pressed against the soft object under your arm, move your forearm across your body toward your abdomen. Keep your body steady so the movement is only coming from your shoulder. 5. Hold for 3 seconds. 6. Slowly return to the starting position. Repeat for a total of 10 repetitions. Repeat 2 times. Complete this exercise 3 times per week.  Exercise I: External rotation    1. Sit in a stable chair without armrests, or stand. 2. Secure an exercise band at your left / right side, at elbow height. 3. Place a soft object, such as a folded towel or a small pillow, under your left / right upper arm so your elbow is a few inches (about 8 cm) away from your side. 4. Hold the end of the exercise band so the band stretches. 5. Keeping your elbow pressed against the soft object under your arm, move your forearm out, away from your abdomen. Keep your body steady so the movement is only coming from your shoulder. 6. Hold for 3 seconds. 7. Slowly return to the starting position. Repeat for a total of 10 repetitions. Repeat 2 times. Complete this exercise 3 times per week. Exercise J: Shoulder extension  1. Sit in a stable chair without armrests, or stand. Secure an exercise band to a stable object in front of you so the band  is at shoulder height. 2. Hold one end of the exercise band in each hand. Your palms should face each other. 3. Straighten your elbows and lift your hands up to shoulder height. 4. Step back, away from the secured end of the exercise band, until the band stretches. 5. Squeeze your shoulder blades together and pull your hands down to the sides of your thighs. Stop when your hands are straight down by your sides. Do not let your hands go behind your body. 6. Hold for 3 seconds. 7. Slowly return to the starting position. Repeat for a total of 10 repetitions. Repeat 2 times. Complete this exercise 3 times per week.  Exercise K: Shoulder extension, prone    1. Lie on your abdomen on a firm surface so your left / right arm hangs over the edge. 2. Hold a 5 lb weight in your hand so your palm faces in toward your body. Your arm should be straight. 3. Squeeze your shoulder blade down toward the middle of your back. 4. Slowly raise your arm behind you, up to the height of the surface that you are lying on. Keep your arm straight. 5. Hold for 3 seconds. 6. Slowly return to the starting position and relax your muscles. Repeat for a total of 10 repetitions. Repeat 2 times. Complete this exercise 3 times per week.   Exercise L: Horizontal abduction, prone  1. Lie on your abdomen on a firm surface so your left / right arm hangs over the edge. 2. Hold a 5 lb weight in your hand so your palm faces toward your feet. Your arm should be straight. 3. Squeeze your shoulder blade down toward the middle of your back. 4. Bend your elbow so your hand moves up, until your elbow is bent to an "L" shape (90 degrees). With your elbow bent, slowly move your forearm forward and up. Raise your hand up to the height of the surface that you are lying on. ? Your upper arm should not move, and your elbow should stay bent. ? At the top of the movement, your palm should face the floor. 5. Hold  for 3 seconds. 6. Slowly return  to the starting position and relax your muscles. Repeat for a total of 10 repetitions. Repeat 2 times. Complete this exercise 3 times per week.  Exercise M: Horizontal abduction, standing  1. Sit on a stable chair, or stand. 2. Secure an exercise band to a stable object in front of you so the band is at shoulder height. 3. Hold one end of the exercise band in each hand. 4. Straighten your elbows and lift your hands straight in front of you, up to shoulder height. Your palms should face down, toward the floor. 5. Step back, away from the secured end of the exercise band, until the band stretches. 6. Move your arms out to your sides, and keep your arms straight. 7. Hold for 3 seconds. 8. Slowly return to the starting position. Repeat for a total of 10 repetitions. Repeat 2 times. Complete this exercise 3 times per week.  Exercise N: Scapular retraction and elevation  1. Sit on a stable chair, or stand. 2. Secure an exercise band to a stable object in front of you so the band is at shoulder height. 3. Hold one end of the exercise band in each hand. Your palms should face each other. 4. Sit in a stable chair without armrests, or stand. 5. Step back, away from the secured end of the exercise band, until the band stretches. 6. Squeeze your shoulder blades together and lift your hands over your head. Keep your elbows straight. 7. Hold for 3 seconds. 8. Slowly return to the starting position. Repeat for a total of 10 repetitions. Repeat 2 times. Complete this exercise 3 times per week.  This information is not intended to replace advice given to you by your health care provider. Make sure you discuss any questions you have with your health care provider. Document Released: 01/24/2005 Document Revised: 10/01/2015 Document Reviewed: 12/11/2014 Elsevier Interactive Patient Education  2017 [employer].

## 2017-10-30 NOTE — Progress Notes (Signed)
Chief Complaint  Patient presents with  . Follow-up    bilateral shoulder pain, R index finger lesion, meds    Subjective Vicki Lawrence is a 42 y.o. female who presents for hypertension follow up. She does not monitor home blood pressures. She has been off of HCTZ for nearly 8 weeks. She is not adhering to a healthy diet overall. Current exercise: Cardio and lifting  B/l shoulder pain over past several weeks. Has been working more and thinks this is contributing. She denies inj otherwise. No neurologic s/s's. Tried massage but it was too painful. Heat has been somewhat helpful.  Wart on finger. Wondering what to do for it.    Past Medical History:  Diagnosis Date  . Ectopic pregnancy without intrauterine pregnancy   . Headache   . Hypertension   . Migraines     Review of Systems Cardiovascular: no chest pain Respiratory:  no shortness of breath  Exam BP 107/80 (BP Location: Right Arm, Patient Position: Sitting, Cuff Size: Large)   Pulse 88   Temp 98.3 F (36.8 C) (Oral)   Resp 16   Ht 5\' 3"  (1.6 m)   Wt (!) 321 lb (145.6 kg)   LMP 10/27/2017 (Exact Date)   SpO2 100%   BMI 56.86 kg/m  General:  well developed, well nourished, in no apparent distress Heart: RRR, no bruits, no LE edema Lungs: clear to auscultation, no accessory muscle use Skin: Verrucae on palmar surface of 2nd digit on R, over distal phalanx pulp Psych: well oriented with normal range of affect and appropriate judgment/insight  Essential hypertension  Other viral warts  Strain of trapezius muscle, unspecified laterality, initial encounter  Stop HCTZ and ck BP's at home.  Counseled on diet and exercise. Paring down lesion + Compound W rec'd as initial tx. Heat, Tylenol, Ibuprofen, stretches/exercises. If no improvement, will consider PT.  F/u in 6 weeks. The patient voiced understanding and agreement to the plan.  Jilda Rocheicholas [place], DO 10/30/17  11:22 AM

## 2017-11-06 ENCOUNTER — Encounter: Payer: Self-pay | Admitting: Obstetrics & Gynecology

## 2017-11-06 ENCOUNTER — Ambulatory Visit (INDEPENDENT_AMBULATORY_CARE_PROVIDER_SITE_OTHER): Payer: 59 | Admitting: Obstetrics & Gynecology

## 2017-11-06 VITALS — BP 163/98 | HR 75 | Ht 63.0 in | Wt 325.0 lb

## 2017-11-06 DIAGNOSIS — Z1231 Encounter for screening mammogram for malignant neoplasm of breast: Secondary | ICD-10-CM | POA: Diagnosis not present

## 2017-11-06 DIAGNOSIS — F17200 Nicotine dependence, unspecified, uncomplicated: Secondary | ICD-10-CM

## 2017-11-06 DIAGNOSIS — Z01419 Encounter for gynecological examination (general) (routine) without abnormal findings: Secondary | ICD-10-CM | POA: Diagnosis not present

## 2017-11-06 DIAGNOSIS — Z1151 Encounter for screening for human papillomavirus (HPV): Secondary | ICD-10-CM | POA: Diagnosis not present

## 2017-11-06 DIAGNOSIS — N96 Recurrent pregnancy loss: Secondary | ICD-10-CM | POA: Diagnosis not present

## 2017-11-06 DIAGNOSIS — I10 Essential (primary) hypertension: Secondary | ICD-10-CM

## 2017-11-06 DIAGNOSIS — Z124 Encounter for screening for malignant neoplasm of cervix: Secondary | ICD-10-CM

## 2017-11-06 DIAGNOSIS — Z1239 Encounter for other screening for malignant neoplasm of breast: Secondary | ICD-10-CM

## 2017-11-06 NOTE — Progress Notes (Signed)
Subjective:     Vicki Lawrence is a 42 y.o. female here for a routine exam. [id number]  LMP Sept 20, 2019 Current complaints: Pt with a h/o recurrent pregnancy loss.  Wants an eval. Not sure she wants to be referred to [facility].  She smokes.   Gynecologic History Patient's last menstrual period was 10/27/2017 (exact date). Contraception: none Last Pap: 1 1/2- 2 years. Results were: normal Last mammogram: never had one  Obstetric History OB History  Gravida Para Term Preterm AB Living  4 1 1   3 1   SAB TAB Ectopic Multiple Live Births  2   1   1     # Outcome Date GA Lbr Len/2nd Weight Sex Delivery Anes PTL Lv  4 SAB 2018          3 SAB 2017          2 Term 1994 [redacted]w[redacted]d   M Vag-Spont EPI  LIV  1 Ectopic 1992           The following portions of the patient's history were reviewed and updated as appropriate: allergies, current medications, past family history, past medical history, past social history, past surgical history and problem list.  Review of Systems Pertinent items are noted in HPI.    Objective:  BP (!) 163/98   Pulse 75   Ht 5\' 3"  (1.6 m)   Wt (!) 325 lb 0.6 oz (147.4 kg)   LMP 10/27/2017 (Exact Date)   BMI 57.58 kg/m     General Appearance:    Alert, cooperative, no distress, appears stated age  Head:    Normocephalic, without obvious abnormality, atraumatic  Eyes:    conjunctiva/corneas clear, EOM's intact, both eyes  Ears:    Normal external ear canals, both ears  Nose:   Nares normal, septum midline, mucosa normal, no drainage    or sinus tenderness  Throat:   Lips, mucosa, and tongue normal; teeth and gums normal  Neck:   Supple, symmetrical, trachea midline, no adenopathy;    thyroid:  no enlargement/tenderness/nodules  Back:     Symmetric, no curvature, ROM normal, no CVA tenderness  Lungs:     Clear to auscultation bilaterally, respirations unlabored  Chest Wall:    No tenderness or deformity   Heart:    Regular rate and rhythm, S1 and S2 normal, no  murmur, rub   or gallop  Breast Exam:    No tenderness, masses, or nipple abnormality  Abdomen:     Soft, non-tender, bowel sounds active all four quadrants,    no masses, no organomegaly  Genitalia:    Normal female without lesion, discharge or tenderness; uterus small.      Extremities:   Extremities normal, atraumatic, no cyanosis or edema  Pulses:   2+ and symmetric all extremities  Skin:   Skin color, texture, turgor normal, no rashes or lesions     Assessment:    Healthy female exam.   Benign essential HTN  Breast cancer screening Recurrent pregnancy loss tob use obesity   Plan:    Mammogram ordered. Follow up in: 1 year.    Reviewed risk factors for HTN Pt asked to f/u with primary care re BP.  Labs: ACA, LA, TSH, FSH, HgbA1c for eval of recurrent pregnancy loss  Lemar Bakos L. Harraway-Smith, M.D., [place]

## 2017-11-08 LAB — LUPUS ANTICOAGULANT
DPT CONFIRM RATIO: 0.82 ratio (ref 0.00–1.40)
Dilute Viper Venom Time: 25.9 s (ref 0.0–47.0)
PTT Lupus Anticoagulant: 39.7 s (ref 0.0–51.9)
THROMBIN TIME: 18 s (ref 0.0–23.0)
dPT: 29.9 s (ref 0.0–55.0)

## 2017-11-08 LAB — TSH: TSH: 2.43 u[IU]/mL (ref 0.450–4.500)

## 2017-11-08 LAB — HEMOGLOBIN A1C
Est. average glucose Bld gHb Est-mCnc: 114 mg/dL
Hgb A1c MFr Bld: 5.6 % (ref 4.8–5.6)

## 2017-11-08 LAB — CARDIOLIPIN ANTIBODIES, IGG, IGM, IGA: Anticardiolipin IgM: 16 MPL U/mL — ABNORMAL HIGH (ref 0–12)

## 2017-11-08 LAB — FOLLICLE STIMULATING HORMONE: FSH: 4.5 m[IU]/mL

## 2017-11-08 LAB — CYTOLOGY - PAP
Diagnosis: NEGATIVE
HPV (WINDOPATH): NOT DETECTED

## 2017-11-17 ENCOUNTER — Ambulatory Visit (HOSPITAL_BASED_OUTPATIENT_CLINIC_OR_DEPARTMENT_OTHER): Payer: 59

## 2017-11-18 ENCOUNTER — Ambulatory Visit (HOSPITAL_BASED_OUTPATIENT_CLINIC_OR_DEPARTMENT_OTHER): Payer: 59

## 2017-12-11 ENCOUNTER — Ambulatory Visit: Payer: 59 | Admitting: Family Medicine

## 2017-12-20 ENCOUNTER — Encounter: Payer: Self-pay | Admitting: Family Medicine

## 2017-12-20 ENCOUNTER — Ambulatory Visit: Payer: 59 | Admitting: Family Medicine

## 2017-12-20 DIAGNOSIS — Z0289 Encounter for other administrative examinations: Secondary | ICD-10-CM

## 2018-04-09 ENCOUNTER — Encounter: Payer: Self-pay | Admitting: Family Medicine

## 2018-04-09 ENCOUNTER — Ambulatory Visit: Payer: 59 | Admitting: Family Medicine

## 2018-04-09 VITALS — BP 130/78 | HR 97 | Temp 98.7°F | Ht 63.0 in | Wt 320.1 lb

## 2018-04-09 DIAGNOSIS — S86812A Strain of other muscle(s) and tendon(s) at lower leg level, left leg, initial encounter: Secondary | ICD-10-CM

## 2018-04-09 DIAGNOSIS — M7631 Iliotibial band syndrome, right leg: Secondary | ICD-10-CM | POA: Diagnosis not present

## 2018-04-09 DIAGNOSIS — M7061 Trochanteric bursitis, right hip: Secondary | ICD-10-CM | POA: Diagnosis not present

## 2018-04-09 MED ORDER — MELOXICAM 15 MG PO TABS: 15.0000 mg | ORAL_TABLET | Freq: Every day | ORAL | 0 refills | Status: DC

## 2018-04-09 NOTE — Progress Notes (Signed)
Musculoskeletal Exam  Patient: Vicki Lawrence DOB: [birth year]  DOS: 04/09/2018  SUBJECTIVE:  Chief Complaint:   Chief Complaint  Patient presents with  . Leg Pain    both   Vicki Lawrence is a 43 y.o.  female for evaluation and treatment of b/l leg pain.   Onset:  1 month ago R thigh hurt, L leg started 2 weeks ago. No inj or change in activity.  Location: R lateral thigh and L lateral leg Character:  aching  Progression of issue:  is unchanged Associated symptoms: seems to get worse with exertion, better with rest Treatment: to date has been home exercises and massage.   Neurovascular symptoms: no  ROS: Musculoskeletal/Extremities: +thigh and leg pain  Neuro: No weakness  Past Medical History:  Diagnosis Date  . Ectopic pregnancy without intrauterine pregnancy   . Headache   . Hypertension   . Migraines     Objective: VITAL SIGNS: BP 130/78 (BP Location: Left Arm, Patient Position: Sitting, Cuff Size: Large)   Pulse 97   Temp 98.7 F (37.1 C) (Oral)   Ht 5\' 3"  (1.6 m)   Wt (!) 320 lb 2 oz (145.2 kg)   SpO2 97%   BMI 56.71 kg/m  Constitutional: Well formed, well developed. No acute distress. Cardiovascular: Brisk cap refill Thorax & Lungs: No accessory muscle use Musculoskeletal: R thigh  Tenderness to palpation: yes- over distribution of IT band, worse over  Deformity: no Ecchymosis: no Tests positive: Obers  Tests negative: Stinchfield, logroll L lower ext- +TTP over TA and peroneus longus/brevis near lateral lat mall, neg squeeze and ant drawer Neurologic: Normal sensory function. No focal deficits noted. Marland Kitchen Psychiatric: Normal mood. Age appropriate judgment and insight. Alert & oriented x 3.    Assessment:  Greater trochanteric bursitis of right hip - Plan: meloxicam (MOBIC) 15 MG tablet  Iliotibial band syndrome of right side - Plan: meloxicam (MOBIC) 15 MG tablet  Strain of left tibialis anterior muscle, initial encounter - Plan:  meloxicam (MOBIC) 15 MG tablet  Plan: Orders as above. Tylenol, stretches/exercises, ice, heat. If no improvement, let us know. Will consider PT.  F/u as originally scheduled. The patient voiced understanding and agreement to the plan.   Vicki Lawrence [place], DO 04/09/18  10:35 AM

## 2018-04-09 NOTE — Patient Instructions (Addendum)
Ice/cold pack over area for 10-15 min twice daily.  Heat (pad or rice pillow in microwave) over affected area, 10-15 minutes twice daily.   OK to take Tylenol 1000 mg (2 extra strength tabs) or 975 mg (3 regular strength tabs) every 6 hours as needed.  Send me a message in 4 weeks if no better. Come in for injection if painful enough.  Iliotibial Band Syndrome Rehab It is normal to feel mild stretching, pulling, tightness, or discomfort as you do these exercises, but you should stop right away if you feel sudden pain or your pain gets worse.  Stretching and range of motion exercises These exercises warm up your muscles and joints and improve the movement and flexibility of your hip and pelvis. Exercise A: Quadriceps, prone    1. Lie on your abdomen on a firm surface, such as a bed or padded floor. 2. Bend your left / right knee and hold your ankle. If you cannot reach your ankle or pant leg, loop a belt around your foot and grab the belt instead. 3. Gently pull your heel toward your buttocks. Your knee should not slide out to the side. You should feel a stretch in the front of your thigh and knee. 4. Hold this position for 30 seconds. Repeat 2 times. Complete this stretch 3 times per week. Exercise B: Iliotibial band    1. Lie on your side with your left / right leg in the top position. 2. Bend both of your knees and grab your left / right ankle. Stretch out your bottom arm to help you balance. 3. Slowly bring your top knee back so your thigh goes behind your trunk. 4. Slowly lower your top leg toward the floor until you feel a gentle stretch on the outside of your left / right hip and thigh. If you do not feel a stretch and your knee will not fall farther, place the heel of your other foot on top of your knee and pull your knee down toward the floor with your foot. 5. Hold this position for 30 seconds. Repeat 2 times. Complete this stretch 3 times per week. Strengthening  exercises These exercises build strength and endurance in your hip and pelvis. Endurance is the ability to use your muscles for a long time, even after they get tired. Exercise C: Straight leg raises (hip abductors)     1. Lie on your side with your left / right leg in the top position. Lie so your head, shoulder, knee, and hip line up. You may bend your bottom knee to help you balance. 2. Roll your hips slightly forward so your hips are stacked directly over each other and your left / right knee is facing forward. 3. Tense the muscles in your outer thigh and lift your top leg 4-6 inches (10-15 cm). 4. Hold this position for 3 seconds. Repeat for a total of 10 reps. 5. Slowly return to the starting position. Let your muscles relax completely before doing another repetition. Repeat 2 times. Complete this exercise 3 times per week. Exercise D: Straight leg raises (hip extensors) 1. Lie on your abdomen on your bed or a firm surface. You can put a pillow under your hips if that is more comfortable. 2. Bend your left / right knee so your foot is straight up in the air. 3. Squeeze your buttock muscles and lift your left / right thigh off the bed. Do not let your back arch. 4. Tense this muscle as  hard as you can without increasing any knee pain. 5. Hold this position for 2 seconds. Repeat for a total of 10 reps 6. Slowly lower your leg to the starting position and allow it to relax completely. Repeat 2 times. Complete this exercise 3 times per week. Exercise E: Hip hike 1. Stand sideways on a bottom step. Stand on your left / right leg with your other foot unsupported next to the step. You can hold onto the railing or wall if needed for balance. 2. Keep your knees straight and your torso square. Then, lift your left / right hip up toward the ceiling. 3. Slowly let your left / right hip lower toward the floor, past the starting position. Your foot should get closer to the floor. Do not lean or bend  your knees. Repeat 2 times. Complete this exercise 3 times per week.  Document Released: 01/24/2005 Document Revised: 09/29/2015 Document Reviewed: 12/26/2014 Elsevier Interactive Patient Education  2018 [employer]    Ankle Exercises It is normal to feel mild stretching, pulling, tightness, or discomfort as you do these exercises, but you should stop right away if you feel sudden pain or your pain gets worse.  Stretching and range of motion exercises These exercises warm up your muscles and joints and improve the movement and flexibility of your ankle. These exercises also help to relieve pain, numbness, and tingling. Exercise A: Dorsiflexion/Plantar Flexion   1. Sit with your affected knee straight or bent. Do not rest your foot on anything. 2. Flex your affected ankle to tilt the top of your foot toward your shin. 3. Hold this position for 5 seconds. 4. Point your toes downward to tilt the top of your foot away from your shin. 5. Hold this position for 5 seconds. Repeat 2 times. Complete this exercise 3 times per week. Exercise B: Ankle Alphabet   1. Sit with your affected foot supported at your lower leg. ? Do not rest your foot on anything. ? Make sure your foot has room to move freely. 2. Think of your affected foot as a paintbrush, and move your foot to trace each letter of the alphabet in the air. Keep your hip and knee still while you trace. Make the letters as large as you can without increasing any discomfort. 3. Trace every letter from A to Z. Repeat 2 times. Complete this exercise 3 times per week. Strengthening exercises These exercises build strength and endurance in your ankle. Endurance is the ability to use your muscles for a long time, even after they get tired. Exercise D: Dorsiflexors   1. Secure a rubber exercise band or tube to an object, such as a table leg, that will stay still when the band is pulled. Secure the other end around your affected  foot. 2. Sit on the floor, facing the object with your affected leg extended. The band or tube should be slightly tense when your foot is relaxed. 3. Slowly flex your affected ankle and toes to bring your foot toward you. 4. Hold this position for 3 seconds.  5. Slowly return your foot to the starting position, controlling the band as you do that. Do a total of 10 repetitions. Repeat 2 times. Complete this exercise 3 times per week. Exercise E: Plantar Flexors   1. Sit on the floor with your affected leg extended. 2. Loop a rubber exercise band or tube around the ball of your affected foot. The ball of your foot is on the walking surface,  right under your toes. The band or tube should be slightly tense when your foot is relaxed. 3. Slowly point your toes downward, pushing them away from you. 4. Hold this position for 3 seconds. 5. Slowly release the tension in the band or tube, controlling smoothly until your foot is back in the starting position. Repeat for a total of 10 repetitions. Repeat 2 times. Complete this exercise 3 times per week. Exercise F: Towel Curls   1. Sit in a chair on a non-carpeted surface, and put your feet on the floor. 2. Place a towel in front of your feet.  3. Keeping your heel on the floor, put your affected foot on the towel. 4. Pull the towel toward you by grabbing the towel with your toes and curling them under. Keep your heel on the floor. 5. Let your toes relax. 6. Grab the towel again. Keep going until the towel is completely underneath your foot. Repeat for a total of 10 repetitions. Repeat 2 times. Complete this exercise 3 times per week. Exercise G: Heel Raise ( Plantar Flexors, Standing)    1. Stand with your feet shoulder-width apart. 2. Keep your weight spread evenly over the width of your feet while you rise up on your toes. Use a wall or table to steady yourself, but try not to use it for support. 3. If this exercise is too easy, try these  options: ? Shift your weight toward your affected leg until you feel challenged. ? If told by your health care provider, lift your uninjured leg off the floor. 4. Hold this position for 3 seconds. Repeat for a total of 10 repetitions. Repeat 2 times. Complete this exercise 3 times per week. Exercise H: Tandem Walking 1. Stand with one foot directly in front of the other. 2. Slowly raise your back foot up, lifting your heel before your toes, and place it directly in front of your other foot. 3. Continue to walk in this heel-to-toe way for 10 steps or for as long as told by your health care provider. Have a countertop or wall nearby to use if needed to keep your balance, but try not to hold onto anything for support. Repeat 2 times. Complete this exercises 3 times per week. Make sure you discuss any questions you have with your health care provider. Document Released: 12/08/2004 Document Revised: 09/24/2015 Document Reviewed: 10/12/2014 Elsevier Interactive Patient Education  2018 [employer]

## 2018-09-11 ENCOUNTER — Other Ambulatory Visit: Payer: Self-pay

## 2018-09-12 ENCOUNTER — Ambulatory Visit: Payer: 59 | Admitting: Family Medicine

## 2018-09-12 ENCOUNTER — Encounter: Payer: Self-pay | Admitting: Family Medicine

## 2018-09-12 VITALS — BP 142/102 | HR 93 | Temp 98.8°F | Ht 63.0 in | Wt 323.0 lb

## 2018-09-12 DIAGNOSIS — S46812A Strain of other muscles, fascia and tendons at shoulder and upper arm level, left arm, initial encounter: Secondary | ICD-10-CM | POA: Diagnosis not present

## 2018-09-12 DIAGNOSIS — R030 Elevated blood-pressure reading, without diagnosis of hypertension: Secondary | ICD-10-CM

## 2018-09-12 DIAGNOSIS — M7061 Trochanteric bursitis, right hip: Secondary | ICD-10-CM | POA: Insufficient documentation

## 2018-09-12 DIAGNOSIS — Z566 Other physical and mental strain related to work: Secondary | ICD-10-CM | POA: Diagnosis not present

## 2018-09-12 MED ORDER — MELOXICAM 15 MG PO TABS: 15.0000 mg | ORAL_TABLET | Freq: Every day | ORAL | 0 refills | Status: DC

## 2018-09-12 NOTE — Progress Notes (Signed)
Musculoskeletal Exam  Patient: Vicki Lawrence DOB: 03/23/1975  DOS: 09/12/2018  SUBJECTIVE:  Chief Complaint:   Chief Complaint  Patient presents with  . Arm Pain    left    Vicki Lawrence is a 43 y.o.  female for evaluation and treatment of L arm pain.   Onset:  1 month ago. No inj or change in activity.  Location: L trap, upper arm; outside Character:  dullness, trouble describing  Intermittent, lasts for several minutes. Progression of issue:  has worsened over last week Associated symptoms: feels numbness over area Treatment: to date has been massage.   Neurovascular symptoms: no  HTN- Has lost inches by improving diet and increasing physical activity.  She forgot her blood pressure log today.  No chest pain or shortness of breath.  She is not currently taking any medication.  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  She is curious if this is contributing to her symptoms above.  ROS: Musculoskeletal/Extremities: R arm dullness  Cardiac: No CP  Past Medical History:  Diagnosis Date  . Ectopic pregnancy without intrauterine pregnancy   . Headache   . Hypertension   . Migraines     Objective: VITAL SIGNS: BP (!) 142/102 (BP Location: Right Arm, Cuff Size: Large)   Pulse 93   Temp 98.8 F (37.1 C) (Oral)   Ht 5\' 3"  (1.6 m)   Wt (!) 323 lb (146.5 kg)   SpO2 94%   BMI 57.22 kg/m  Constitutional: Well formed, well developed. No acute distress. Cardiovascular: RRR Thorax & Lungs: CTAB, No accessory muscle use Musculoskeletal: L UE.   Normal active range of motion: yes.   Normal passive range of motion: yes Tenderness to palpation: mild ttp over L trap Deformity: no Ecchymosis: no Tests positive: none Tests negative: Neer's, hawkins, empty can, speed's, cross over, lift off Neurologic: Normal sensory function. No focal deficits noted. DTR's equal and symmetric in UE's. No  clonus. Psychiatric: Normal mood. Age appropriate judgment and insight. Alert & oriented x 3.    Assessment:  Strain of left trapezius muscle, initial encounter - Plan: meloxicam (MOBIC) 15 MG tablet, stretches/exercises, heat, ice  Stress at work - Plan: Continue to monitor, will consider medication in the future.  Elevated blood pressure reading - Plan: Elevated today, check blood pressures at home.  Continue improved eating and exercise habits.  I will see her in 1 month for this, she is to bring her log.  Plan: Orders as above. F/u in 1 mo. The patient voiced understanding and agreement to the plan.   [place], DO 09/12/18  12:02 PM

## 2018-09-12 NOTE — Patient Instructions (Signed)
Ice/cold pack over area for 10-15 min twice daily.  OK to take Tylenol 1000 mg (2 extra strength tabs) or 975 mg (3 regular strength tabs) every 6 hours as needed.  Heat (pad or rice pillow in microwave) over affected area, 10-15 minutes twice daily.   Trapezius stretches/exercises Do exercises exactly as told by your health care provider and adjust them as directed. It is normal to feel mild stretching, pulling, tightness, or discomfort as you do these exercises, but you should stop right away if you feel sudden pain or your pain gets worse.  Stretching and range of motion exercises These exercises warm up your muscles and joints and improve the movement and flexibility of your shoulder. These exercises can also help to relieve pain, numbness, and tingling. If you are unable to do any of the following for any reason, do not further attempt to do it.   Exercise A: Flexion, standing    1. Stand and hold a broomstick, a cane, or a similar object. Place your hands a little more than shoulder-width apart on the object. Your left / right hand should be palm-up, and your other hand should be palm-down. 2. Push the stick to raise your left / right arm out to your side and then over your head. Use your other hand to help move the stick. Stop when you feel a stretch in your shoulder, or when you reach the angle that is recommended by your health care provider. ? Avoid shrugging your shoulder while you raise your arm. Keep your shoulder blade tucked down toward your spine. 3. Hold for 30 seconds. 4. Slowly return to the starting position. Repeat 2 times. Complete this exercise 3 times per week.  Exercise B: Abduction, supine    1. Lie on your back and hold a broomstick, a cane, or a similar object. Place your hands a little more than shoulder-width apart on the object. Your left / right hand should be palm-up, and your other hand should be palm-down. 2. Push the stick to raise your left / right  arm out to your side and then over your head. Use your other hand to help move the stick. Stop when you feel a stretch in your shoulder, or when you reach the angle that is recommended by your health care provider. ? Avoid shrugging your shoulder while you raise your arm. Keep your shoulder blade tucked down toward your spine. 3. Hold for 30 seconds. 4. Slowly return to the starting position. Repeat 2 times. Complete this exercise 3 times per week.  Exercise C: Flexion, active-assisted    1. Lie on your back. You may bend your knees for comfort. 2. Hold a broomstick, a cane, or a similar object. Place your hands about shoulder-width apart on the object. Your palms should face toward your feet. 3. Raise the stick and move your arms over your head and behind your head, toward the floor. Use your healthy arm to help your left / right arm move farther. Stop when you feel a gentle stretch in your shoulder, or when you reach the angle where your health care provider tells you to stop. 4. Hold for 30 seconds. 5. Slowly return to the starting position. Repeat 2 times. Complete this exercise 3 times per week.  Exercise D: External rotation and abduction    1. Stand in a door frame with one of your feet slightly in front of the other. This is called a staggered stance. 2. Choose one of the  following positions as told by your health care provider: ? Place your hands and forearms on the door frame above your head. ? Place your hands and forearms on the door frame at the height of your head. ? Place your hands on the door frame at the height of your elbows. 3. Slowly move your weight onto your front foot until you feel a stretch across your chest and in the front of your shoulders. Keep your head and chest upright and keep your abdominal muscles tight. 4. Hold for 30 seconds. 5. To release the stretch, shift your weight to your back foot. Repeat 2 times. Complete this stretch 3 times per week.   Strengthening exercises These exercises build strength and endurance in your shoulder. Endurance is the ability to use your muscles for a long time, even after your muscles get tired. Exercise E: Scapular depression and adduction  1. Sit on a stable chair. Support your arms in front of you with pillows, armrests, or a tabletop. Keep your elbows in line with the sides of your body. 2. Gently move your shoulder blades down toward your middle back. Relax the muscles on the tops of your shoulders and in the back of your neck. 3. Hold for 3 seconds. 4. Slowly release the tension and relax your muscles completely before doing this exercise again. Repeat for a total of 10 repetitions. 5. After you have practiced this exercise, try doing the exercise without the arm support. Then, try the exercise while standing instead of sitting. Repeat 2 times. Complete this exercise 3 times per week.  Exercise F: Shoulder abduction, isometric    1. Stand or sit about 4-6 inches (10-15 cm) from a wall with your left / right side facing the wall. 2. Bend your left / right elbow and gently press your elbow against the wall. 3. Increase the pressure slowly until you are pressing as hard as you can without shrugging your shoulder. 4. Hold for 3 seconds. 5. Slowly release the tension and relax your muscles completely. Repeat for a total of 10 repetitions. Repeat 2 times. Complete this exercise 3 times per week.  Exercise G: Shoulder flexion, isometric    1. Stand or sit about 4-6 inches (10-15 cm) away from a wall with your left / right side facing the wall. 2. Keep your left / right elbow straight and gently press the top of your fist against the wall. Increase the pressure slowly until you are pressing as hard as you can without shrugging your shoulder. 3. Hold for 10-15 seconds. 4. Slowly release the tension and relax your muscles completely. Repeat for a total of 10 repetitions. Repeat 2 times. Complete this  exercise 3 times per week.  Exercise H: Internal rotation    1. Sit in a stable chair without armrests, or stand. Secure an exercise band at your left / right side, at elbow height. 2. Place a soft object, such as a folded towel or a small pillow, under your left / right upper arm so your elbow is a few inches (about 8 cm) away from your side. 3. Hold the end of the exercise band so the band stretches. 4. Keeping your elbow pressed against the soft object under your arm, move your forearm across your body toward your abdomen. Keep your body steady so the movement is only coming from your shoulder. 5. Hold for 3 seconds. 6. Slowly return to the starting position. Repeat for a total of 10 repetitions. Repeat 2 times.  Complete this exercise 3 times per week.  Exercise I: External rotation    1. Sit in a stable chair without armrests, or stand. 2. Secure an exercise band at your left / right side, at elbow height. 3. Place a soft object, such as a folded towel or a small pillow, under your left / right upper arm so your elbow is a few inches (about 8 cm) away from your side. 4. Hold the end of the exercise band so the band stretches. 5. Keeping your elbow pressed against the soft object under your arm, move your forearm out, away from your abdomen. Keep your body steady so the movement is only coming from your shoulder. 6. Hold for 3 seconds. 7. Slowly return to the starting position. Repeat for a total of 10 repetitions. Repeat 2 times. Complete this exercise 3 times per week. Exercise J: Shoulder extension  1. Sit in a stable chair without armrests, or stand. Secure an exercise band to a stable object in front of you so the band is at shoulder height. 2. Hold one end of the exercise band in each hand. Your palms should face each other. 3. Straighten your elbows and lift your hands up to shoulder height. 4. Step back, away from the secured end of the exercise band, until the band  stretches. 5. Squeeze your shoulder blades together and pull your hands down to the sides of your thighs. Stop when your hands are straight down by your sides. Do not let your hands go behind your body. 6. Hold for 3 seconds. 7. Slowly return to the starting position. Repeat for a total of 10 repetitions. Repeat 2 times. Complete this exercise 3 times per week.  Exercise K: Shoulder extension, prone    1. Lie on your abdomen on a firm surface so your left / right arm hangs over the edge. 2. Hold a 5 lb weight in your hand so your palm faces in toward your body. Your arm should be straight. 3. Squeeze your shoulder blade down toward the middle of your back. 4. Slowly raise your arm behind you, up to the height of the surface that you are lying on. Keep your arm straight. 5. Hold for 3 seconds. 6. Slowly return to the starting position and relax your muscles. Repeat for a total of 10 repetitions. Repeat 2 times. Complete this exercise 3 times per week.   Exercise L: Horizontal abduction, prone  1. Lie on your abdomen on a firm surface so your left / right arm hangs over the edge. 2. Hold a 5 lb weight in your hand so your palm faces toward your feet. Your arm should be straight. 3. Squeeze your shoulder blade down toward the middle of your back. 4. Bend your elbow so your hand moves up, until your elbow is bent to an "L" shape (90 degrees). With your elbow bent, slowly move your forearm forward and up. Raise your hand up to the height of the surface that you are lying on. ? Your upper arm should not move, and your elbow should stay bent. ? At the top of the movement, your palm should face the floor. 5. Hold for 3 seconds. 6. Slowly return to the starting position and relax your muscles. Repeat for a total of 10 repetitions. Repeat 2 times. Complete this exercise 3 times per week.  Exercise M: Horizontal abduction, standing  1. Sit on a stable chair, or stand. 2. Secure an exercise band  to a stable  object in front of you so the band is at shoulder height. 3. Hold one end of the exercise band in each hand. 4. Straighten your elbows and lift your hands straight in front of you, up to shoulder height. Your palms should face down, toward the floor. 5. Step back, away from the secured end of the exercise band, until the band stretches. 6. Move your arms out to your sides, and keep your arms straight. 7. Hold for 3 seconds. 8. Slowly return to the starting position. Repeat for a total of 10 repetitions. Repeat 2 times. Complete this exercise 3 times per week.  Exercise N: Scapular retraction and elevation  1. Sit on a stable chair, or stand. 2. Secure an exercise band to a stable object in front of you so the band is at shoulder height. 3. Hold one end of the exercise band in each hand. Your palms should face each other. 4. Sit in a stable chair without armrests, or stand. 5. Step back, away from the secured end of the exercise band, until the band stretches. 6. Squeeze your shoulder blades together and lift your hands over your head. Keep your elbows straight. 7. Hold for 3 seconds. 8. Slowly return to the starting position. Repeat for a total of 10 repetitions. Repeat 2 times. Complete this exercise 3 times per week.  This information is not intended to replace advice given to you by your health care provider. Make sure you discuss any questions you have with your health care provider. Document Released: 01/24/2005 Document Revised: 10/01/2015 Document Reviewed: 12/11/2014 Elsevier Interactive Patient Education  2017 [employer]

## 2018-10-12 ENCOUNTER — Ambulatory Visit: Payer: 59 | Admitting: Obstetrics & Gynecology

## 2018-10-12 ENCOUNTER — Ambulatory Visit: Payer: 59 | Admitting: Family Medicine

## 2018-10-24 ENCOUNTER — Ambulatory Visit: Payer: 59 | Admitting: Family Medicine

## 2018-10-24 DIAGNOSIS — Z0289 Encounter for other administrative examinations: Secondary | ICD-10-CM

## 2018-11-10 ENCOUNTER — Other Ambulatory Visit: Payer: Self-pay

## 2018-11-10 ENCOUNTER — Emergency Department (HOSPITAL_BASED_OUTPATIENT_CLINIC_OR_DEPARTMENT_OTHER)
Admission: EM | Admit: 2018-11-10 | Discharge: 2018-11-10 | Disposition: A | Discharge status: Home / Self Care | Payer: 59 | Attending: Emergency Medicine | Admitting: Emergency Medicine

## 2018-11-10 ENCOUNTER — Encounter (HOSPITAL_BASED_OUTPATIENT_CLINIC_OR_DEPARTMENT_OTHER): Payer: Self-pay | Admitting: *Deleted

## 2018-11-10 DIAGNOSIS — Z79899 Other long term (current) drug therapy: Secondary | ICD-10-CM | POA: Diagnosis not present

## 2018-11-10 DIAGNOSIS — I10 Essential (primary) hypertension: Secondary | ICD-10-CM | POA: Diagnosis not present

## 2018-11-10 DIAGNOSIS — K029 Dental caries, unspecified: Secondary | ICD-10-CM | POA: Insufficient documentation

## 2018-11-10 DIAGNOSIS — K0889 Other specified disorders of teeth and supporting structures: Secondary | ICD-10-CM | POA: Insufficient documentation

## 2018-11-10 DIAGNOSIS — Z87891 Personal history of nicotine dependence: Secondary | ICD-10-CM | POA: Diagnosis not present

## 2018-11-10 MED ORDER — HYDROCODONE-ACETAMINOPHEN 5-325 MG PO TABS: 1.0000 | ORAL_TABLET | Freq: Four times a day (QID) | ORAL | 0 refills | Status: DC | PRN

## 2018-11-10 MED ORDER — CLINDAMYCIN HCL 300 MG PO CAPS: 300.0000 mg | ORAL_CAPSULE | Freq: Four times a day (QID) | ORAL | 0 refills | Status: DC

## 2018-11-10 MED ORDER — CLINDAMYCIN HCL 150 MG PO CAPS
300.0000 mg | ORAL_CAPSULE | Freq: Once | ORAL | Status: AC
  Administered 2018-11-10: 300 mg via ORAL
  Filled 2018-11-10: qty 2

## 2018-11-10 MED ORDER — HYDROCODONE-ACETAMINOPHEN 5-325 MG PO TABS
1.0000 | ORAL_TABLET | Freq: Once | ORAL | Status: AC
  Administered 2018-11-10: 1 via ORAL
  Filled 2018-11-10: qty 1

## 2018-11-10 NOTE — ED Notes (Signed)
.  edDental

## 2018-11-10 NOTE — ED Provider Notes (Signed)
[facility] Provider Note: Lowella Dell, MD, FACEP  CSN: [id number] MRN: [id number] ARRIVAL: 11/10/18 at 0049 ROOM: MH09/MH09   CHIEF COMPLAINT  Dental Pain   HISTORY OF PRESENT ILLNESS  11/10/18 2:55 AM Vicki Lawrence is a 43 y.o. female who states she has multiple teeth that broke the summer but she avoided seeking help due to COVID-19.  She is having pain in her upper and lower molars which she rates as a 7 out of 10.  It is worse with eating or drinking.  She has been taking over-the-counter analgesics without relief.  She was given a course of clindamycin through a telemedicine visit but continues to have pain.  She has not seen a dentist nor does she have a dentist to see.   Past Medical History:  Diagnosis Date  . Ectopic pregnancy without intrauterine pregnancy   . Headache   . Hypertension   . Migraines     Past Surgical History:  Procedure Laterality Date  . ECTOPIC PREGNANCY SURGERY  1992   Left salpingectomy per patient    Family History  Problem Relation Age of Onset  . Cancer Paternal Grandfather        prostate  . Cancer Mother 92       leukemia  . Hypertension Mother   . High Cholesterol Mother   . Heart attack Maternal Grandmother     Social History   Tobacco Use  . Smoking status: Former Smoker    Types: Cigarettes  . Smokeless tobacco: Never Used  Substance Use Topics  . Alcohol use: Yes    Comment: Occasional- socially  . Drug use: No    Prior to Admission medications   Medication Sig Start Date End Date Taking? Authorizing Provider  meloxicam (MOBIC) 15 MG tablet Take 1 tablet (15 mg total) by mouth daily. 09/12/18   Sharlene Dory, DO  Multiple Vitamin (MULTIVITAMIN) tablet Take 1 tablet by mouth daily.    [provider]    Allergies Patient has no known allergies.   REVIEW OF SYSTEMS  Negative except as noted here or in the History of Present Illness.   PHYSICAL EXAMINATION  Initial Vital  Signs Blood pressure (!) 187/89, pulse 87, temperature 98.5 F (36.9 C), temperature source Oral, resp. rate 20, height 5\' 3"  (1.6 m), weight (!) 145.2 kg, last menstrual period 11/10/2018, SpO2 100 %.  Examination General: Well-developed, well-nourished female in no acute distress; appearance consistent with age of record HENT: normocephalic; atraumatic; multiple carious teeth including left lower second and third molars decayed to the gumline Eyes: pupils equal, round and reactive to light; extraocular muscles intact Neck: supple; no lymphadenopathy Heart: regular rate and rhythm Lungs: clear to auscultation bilaterally Abdomen: soft; nondistended; nontender; bowel sounds present Extremities: No deformity; full range of motion; pulses normal Neurologic: Awake, alert and oriented; motor function intact in all extremities and symmetric; no facial droop Skin: Warm and dry Psychiatric: Normal mood and affect   RESULTS  Summary of this visit's results, reviewed by myself:   EKG Interpretation  Date/Time:    Ventricular Rate:    PR Interval:    QRS Duration:   QT Interval:    QTC Calculation:   R Axis:     Text Interpretation:        Laboratory Studies: No results found for this or any previous visit (from the past 24 hour(s)). Imaging Studies: No results found.  ED COURSE and MDM  Nursing notes and initial vitals  signs, including pulse oximetry, reviewed.  Vitals:   11/10/18 0116 11/10/18 0118  BP:  (!) 187/89  Pulse:  87  Resp:  20  Temp:  98.5 F (36.9 C)  TempSrc:  Oral  SpO2:  100%  Weight: (!) 145.2 kg   Height: 5\' 3"  (1.6 m)    Patient declines a dental block.  We will refer to Dr. Hoyt Koch of oral surgery for definitive treatment.  We will restart her on clindamycin in the meantime.  PROCEDURES    ED DIAGNOSES     ICD-10-CM   1. Dental caries  K02.9   2. Pain due to dental caries  K02.9        Franz Svec, MD 11/10/18 [phone]

## 2018-11-10 NOTE — ED Triage Notes (Signed)
Pt reports multiple broken teeth. States that she has completed a round of antibiotics without relief.

## 2019-04-09 ENCOUNTER — Other Ambulatory Visit: Payer: Self-pay | Admitting: Family Medicine

## 2019-04-09 DIAGNOSIS — Z1231 Encounter for screening mammogram for malignant neoplasm of breast: Secondary | ICD-10-CM

## 2019-04-23 ENCOUNTER — Other Ambulatory Visit: Payer: Self-pay

## 2019-04-23 ENCOUNTER — Ambulatory Visit
Admission: RE | Admit: 2019-04-23 | Discharge: 2019-04-23 | Disposition: A | Discharge status: Home / Self Care | Payer: 59 | Source: Ambulatory Visit | Attending: Family Medicine | Admitting: Family Medicine

## 2019-04-23 DIAGNOSIS — Z1231 Encounter for screening mammogram for malignant neoplasm of breast: Secondary | ICD-10-CM

## 2019-04-24 ENCOUNTER — Other Ambulatory Visit: Payer: Self-pay | Admitting: Family Medicine

## 2019-04-24 DIAGNOSIS — R928 Other abnormal and inconclusive findings on diagnostic imaging of breast: Secondary | ICD-10-CM

## 2019-05-02 ENCOUNTER — Other Ambulatory Visit: Payer: 59

## 2019-05-07 ENCOUNTER — Other Ambulatory Visit: Payer: Self-pay

## 2019-05-07 ENCOUNTER — Ambulatory Visit
Admission: RE | Admit: 2019-05-07 | Discharge: 2019-05-07 | Disposition: A | Discharge status: Home / Self Care | Payer: 59 | Source: Ambulatory Visit | Attending: Family Medicine | Admitting: Family Medicine

## 2019-05-07 ENCOUNTER — Other Ambulatory Visit: Payer: Self-pay | Admitting: Family Medicine

## 2019-05-07 DIAGNOSIS — R928 Other abnormal and inconclusive findings on diagnostic imaging of breast: Secondary | ICD-10-CM

## 2019-05-13 ENCOUNTER — Ambulatory Visit: Payer: 59 | Admitting: Obstetrics & Gynecology

## 2019-05-20 ENCOUNTER — Ambulatory Visit (INDEPENDENT_AMBULATORY_CARE_PROVIDER_SITE_OTHER): Payer: 59 | Admitting: Obstetrics & Gynecology

## 2019-05-20 ENCOUNTER — Encounter: Payer: Self-pay | Admitting: Obstetrics & Gynecology

## 2019-05-20 ENCOUNTER — Other Ambulatory Visit (HOSPITAL_COMMUNITY)
Admission: RE | Admit: 2019-05-20 | Discharge: 2019-05-20 | Disposition: A | Discharge status: Home / Self Care | Payer: 59 | Source: Ambulatory Visit | Attending: Obstetrics & Gynecology | Admitting: Obstetrics & Gynecology

## 2019-05-20 ENCOUNTER — Other Ambulatory Visit: Payer: Self-pay

## 2019-05-20 VITALS — BP 176/96 | HR 73 | Ht 63.0 in | Wt 310.0 lb

## 2019-05-20 DIAGNOSIS — Z01419 Encounter for gynecological examination (general) (routine) without abnormal findings: Secondary | ICD-10-CM | POA: Diagnosis present

## 2019-05-20 DIAGNOSIS — R928 Other abnormal and inconclusive findings on diagnostic imaging of breast: Secondary | ICD-10-CM

## 2019-05-20 DIAGNOSIS — I10 Essential (primary) hypertension: Secondary | ICD-10-CM

## 2019-05-20 DIAGNOSIS — B373 Candidiasis of vulva and vagina: Secondary | ICD-10-CM | POA: Diagnosis not present

## 2019-05-20 DIAGNOSIS — N898 Other specified noninflammatory disorders of vagina: Secondary | ICD-10-CM

## 2019-05-20 DIAGNOSIS — Z01411 Encounter for gynecological examination (general) (routine) with abnormal findings: Secondary | ICD-10-CM

## 2019-05-20 DIAGNOSIS — B9689 Other specified bacterial agents as the cause of diseases classified elsewhere: Secondary | ICD-10-CM

## 2019-05-20 DIAGNOSIS — N760 Acute vaginitis: Secondary | ICD-10-CM

## 2019-05-20 MED ORDER — HYDROCHLOROTHIAZIDE 25 MG PO TABS: 25.0000 mg | ORAL_TABLET | Freq: Every day | ORAL | 3 refills | Status: DC

## 2019-05-20 NOTE — Patient Instructions (Signed)

## 2019-05-20 NOTE — Progress Notes (Signed)
Subjective:     Vicki Lawrence is a 44 y.o. female here for a routine exam.LMP April 9th  Current complaints:  Some vaginal dryness. This is new for her. She also reports a sl odor which has resolved.  She has been usign some lubricant from [employer]- name unknown.     Gynecologic History Patient's last menstrual period was 05/11/2019 (exact date). Contraception: none Last Pap: 11/06/2017. Results were: normal Last mammogram: 04/23/2019. Results were: abnormal. Has f/u scheduled for 6/2021i Obstetric History OB History  Gravida Para Term Preterm AB Living  4 1 1   3 1   SAB TAB Ectopic Multiple Live Births  2   1   1     # Outcome Date GA Lbr Len/2nd Weight Sex Delivery Anes PTL Lv  4 SAB 2018          3 SAB 2017          2 Term 1994 [redacted]w[redacted]d   M Vag-Spont EPI  LIV  1 Ectopic 1992           The following portions of the patient's history were reviewed and updated as appropriate: allergies, current medications, past family history, past medical history, past social history, past surgical history and problem list.  Review of Systems Pertinent items are noted in HPI.    Objective:  BP (!) 176/96   Pulse 73   Ht 5\' 3"  (1.6 m)   Wt (!) 310 lb (140.6 kg)   LMP 05/11/2019 (Exact Date)   BMI 54.91 kg/m   General Appearance:    Alert, cooperative, no distress, appears stated age  Head:    Normocephalic, without obvious abnormality, atraumatic  Eyes:    conjunctiva/corneas clear, EOM's intact, both eyes  Ears:    Normal external ear canals, both ears  Nose:   Nares normal, septum midline, mucosa normal, no drainage    or sinus tenderness  Throat:   Lips, mucosa, and tongue normal; teeth and gums normal  Neck:   Supple, symmetrical, trachea midline, no adenopathy;    thyroid:  no enlargement/tenderness/nodules  Back:     Symmetric, no curvature, ROM normal, no CVA tenderness  Lungs:     respirations unlabored  Chest Wall:    No tenderness or deformity   Heart:    Regular rate and  rhythm  Breast Exam:    No tenderness, masses, or nipple abnormality  Abdomen:     Soft, non-tender, bowel sounds active all four quadrants,    no masses, no organomegaly  Genitalia:    Normal female without lesion, discharge or tenderness   There is some skin changes to the labia majus that is consistent with chronic yeast. No excoriations. There is white discharge in the vault.   Extremities:   Extremities normal, atraumatic, no cyanosis or edema  Pulses:   2+ and symmetric all extremities  Skin:   Skin color, texture, turgor normal, no rashes or lesions    Assessment:    Healthy female exam.    F/u PAP with hr HPV Hypertension- uncontrolled.   Reviewed long term risks to pt  Will restart HCTZ 25mg  daily  Pt to f//u with Dr. [redacted]w[redacted]d   Abnormal mammogram   Keep f/u appt in June.   Discussed with pt Vaginal dryness  Suspect yeast infection- f/u Affirm  HgbA1c today  Pt to f//u with Dr. 07/11/2019 L. Harraway-Smith, M.D., 

## 2019-05-21 ENCOUNTER — Ambulatory Visit: Payer: 59 | Admitting: Family Medicine

## 2019-05-21 DIAGNOSIS — Z0289 Encounter for other administrative examinations: Secondary | ICD-10-CM

## 2019-05-21 LAB — CERVICOVAGINAL ANCILLARY ONLY
Bacterial Vaginitis (gardnerella): POSITIVE — AB
Candida Glabrata: NEGATIVE
Candida Vaginitis: POSITIVE — AB
Comment: NEGATIVE
Comment: NEGATIVE
Comment: NEGATIVE

## 2019-05-22 ENCOUNTER — Telehealth: Payer: Self-pay

## 2019-05-22 DIAGNOSIS — B379 Candidiasis, unspecified: Secondary | ICD-10-CM

## 2019-05-22 DIAGNOSIS — B9689 Other specified bacterial agents as the cause of diseases classified elsewhere: Secondary | ICD-10-CM

## 2019-05-22 DIAGNOSIS — N760 Acute vaginitis: Secondary | ICD-10-CM

## 2019-05-22 LAB — CYTOLOGY - PAP
Comment: NEGATIVE
Diagnosis: NEGATIVE
High risk HPV: NEGATIVE

## 2019-05-22 MED ORDER — FLUCONAZOLE 150 MG PO TABS: 150.0000 mg | ORAL_TABLET | Freq: Once | ORAL | 0 refills | Status: AC

## 2019-05-22 MED ORDER — METRONIDAZOLE 500 MG PO TABS: 500.0000 mg | ORAL_TABLET | Freq: Two times a day (BID) | ORAL | 0 refills | Status: DC

## 2019-05-22 NOTE — Telephone Encounter (Signed)
Pt called requesting results. Pt made aware that she has BV and yeast. Advised pt to take the Metronidazole first then take the Diflucan. Advised pt to not drink alcohol while taking the Metronidazole because it can make her sick. Medication was sent to the pharmacy. Understanding was voiced. Virat Prather l Eli Pattillo, CMA

## 2019-05-27 ENCOUNTER — Other Ambulatory Visit: Payer: Self-pay | Admitting: Obstetrics & Gynecology

## 2019-05-27 DIAGNOSIS — B373 Candidiasis of vulva and vagina: Secondary | ICD-10-CM

## 2019-05-27 DIAGNOSIS — B3731 Acute candidiasis of vulva and vagina: Secondary | ICD-10-CM

## 2019-05-27 MED ORDER — FLUCONAZOLE 100 MG PO TABS: 100.0000 mg | ORAL_TABLET | Freq: Every day | ORAL | 0 refills | Status: DC

## 2019-06-11 ENCOUNTER — Ambulatory Visit: Payer: 59 | Admitting: Family Medicine

## 2019-06-11 ENCOUNTER — Other Ambulatory Visit (INDEPENDENT_AMBULATORY_CARE_PROVIDER_SITE_OTHER): Payer: 59

## 2019-06-11 ENCOUNTER — Encounter: Payer: Self-pay | Admitting: Family Medicine

## 2019-06-11 ENCOUNTER — Other Ambulatory Visit: Payer: Self-pay

## 2019-06-11 VITALS — BP 142/100 | HR 70 | Temp 95.9°F | Ht 63.0 in | Wt 309.2 lb

## 2019-06-11 DIAGNOSIS — I10 Essential (primary) hypertension: Secondary | ICD-10-CM | POA: Diagnosis not present

## 2019-06-11 DIAGNOSIS — Z0000 Encounter for general adult medical examination without abnormal findings: Secondary | ICD-10-CM

## 2019-06-11 DIAGNOSIS — E611 Iron deficiency: Secondary | ICD-10-CM | POA: Diagnosis not present

## 2019-06-11 LAB — CBC
HCT: 31.3 % — ABNORMAL LOW (ref 36.0–46.0)
Hemoglobin: 9.9 g/dL — ABNORMAL LOW (ref 12.0–15.0)
MCHC: 31.4 g/dL (ref 30.0–36.0)
MCV: 73.8 fl — ABNORMAL LOW (ref 78.0–100.0)
Platelets: 504 10*3/uL — ABNORMAL HIGH (ref 150.0–400.0)
RBC: 4.25 Mil/uL (ref 3.87–5.11)
RDW: 18.9 % — ABNORMAL HIGH (ref 11.5–15.5)
WBC: 11.4 10*3/uL — ABNORMAL HIGH (ref 4.0–10.5)

## 2019-06-11 LAB — COMPREHENSIVE METABOLIC PANEL
ALT: 7 U/L (ref 0–35)
AST: 9 U/L (ref 0–37)
Albumin: 3.9 g/dL (ref 3.5–5.2)
Alkaline Phosphatase: 77 U/L (ref 39–117)
BUN: 9 mg/dL (ref 6–23)
CO2: 25 mEq/L (ref 19–32)
Calcium: 8.8 mg/dL (ref 8.4–10.5)
Chloride: 107 mEq/L (ref 96–112)
Creatinine, Ser: 0.74 mg/dL (ref 0.40–1.20)
GFR: 102.36 mL/min (ref >60.00)
Glucose, Bld: 100 mg/dL — ABNORMAL HIGH (ref 70–99)
Potassium: 4.1 mEq/L (ref 3.5–5.1)
Sodium: 139 mEq/L (ref 135–145)
Total Bilirubin: 0.3 mg/dL (ref 0.2–1.2)
Total Protein: 7.2 g/dL (ref 6.0–8.3)

## 2019-06-11 LAB — LIPID PANEL
Cholesterol: 200 mg/dL (ref 0–200)
HDL: 36.2 mg/dL — ABNORMAL LOW (ref >39.00)
LDL Cholesterol: 135 mg/dL — ABNORMAL HIGH (ref 0–99)
NonHDL: 164.01
Total CHOL/HDL Ratio: 6
Triglycerides: 147 mg/dL (ref 0.0–149.0)
VLDL: 29.4 mg/dL (ref 0.0–40.0)

## 2019-06-11 LAB — IBC + FERRITIN
Ferritin: 5.4 ng/mL — ABNORMAL LOW (ref 10.0–291.0)
Iron: 20 ug/dL — ABNORMAL LOW (ref 42–145)
Saturation Ratios: 4.6 % — ABNORMAL LOW (ref 20.0–50.0)
Transferrin: 311 mg/dL (ref 212.0–360.0)

## 2019-06-11 LAB — HEMOGLOBIN A1C: Hgb A1c MFr Bld: 5.6 % (ref 4.6–6.5)

## 2019-06-11 MED ORDER — HYDROCHLOROTHIAZIDE 25 MG PO TABS: 25.0000 mg | ORAL_TABLET | Freq: Every day | ORAL | 2 refills | Status: DC

## 2019-06-11 MED ORDER — LISINOPRIL 20 MG PO TABS: 20.0000 mg | ORAL_TABLET | Freq: Every day | ORAL | 3 refills | Status: DC

## 2019-06-11 NOTE — Progress Notes (Signed)
Chief Complaint  Patient presents with  . Hypertension    Subjective Vicki Lawrence is a 44 y.o. female who presents for hypertension follow up. She does monitor home blood pressures. Blood pressures ranging from 130-140's/90-100's on average. She is compliant with medications- HCTZ 25 mg/d. Patient has these side effects of medication: freq urination  She is adhering to a healthy diet overall. Current exercise: dancing/Zumba   Past Medical History:  Diagnosis Date  . Ectopic pregnancy without intrauterine pregnancy   . Headache   . Hypertension   . Migraines     Exam BP (!) 142/100 (BP Location: Left Arm, Patient Position: Sitting, Cuff Size: Large)   Pulse 70   Temp (!) 95.9 F (35.5 C) (Temporal)   Ht 5\' 3"  (1.6 m)   Wt (!) 309 lb 4 oz (140.3 kg)   SpO2 97%   BMI 54.78 kg/m  General:  well developed, well nourished, in no apparent distress Heart: RRR, no bruits, no LE edema Lungs: clear to auscultation, no accessory muscle use Psych: well oriented with normal range of affect and appropriate judgment/insight  Essential hypertension - Plan: lisinopril (ZESTRIL) 20 MG tablet, Basic metabolic panel, hydrochlorothiazide (HYDRODIURIL) 25 MG tablet  Well adult exam - Plan: CBC, Comprehensive metabolic panel, Lipid panel, Hemoglobin A1c  Counseled on diet and exercise. Cont to monitor BP at home. Add ACEi. Cont HCTZ.  F/u in 1 week for labs, 1 mo for CPE/BP reck. The patient voiced understanding and agreement to the plan.  [place], DO 06/11/19  7:36 AM

## 2019-06-11 NOTE — Patient Instructions (Addendum)
Give [place] 2-3 business days to get the results of your labs back.   Keep the diet clean and stay active.  Aim to do some physical exertion for 150 minutes per week. This is typically divided into 5 days per week, 30 minutes per day. The activity should be enough to get your heart rate up. Anything is better than nothing if you have time constraints.  Keep checking your blood pressure.   Let us know if you need anything.

## 2019-06-18 ENCOUNTER — Other Ambulatory Visit: Payer: Self-pay

## 2019-06-18 ENCOUNTER — Other Ambulatory Visit (INDEPENDENT_AMBULATORY_CARE_PROVIDER_SITE_OTHER): Payer: 59

## 2019-06-18 DIAGNOSIS — I10 Essential (primary) hypertension: Secondary | ICD-10-CM | POA: Diagnosis not present

## 2019-06-18 LAB — BASIC METABOLIC PANEL
BUN: 13 mg/dL (ref 6–23)
CO2: 28 mEq/L (ref 19–32)
Calcium: 9.2 mg/dL (ref 8.4–10.5)
Chloride: 98 mEq/L (ref 96–112)
Creatinine, Ser: 0.96 mg/dL (ref 0.40–1.20)
GFR: 75.79 mL/min (ref >60.00)
Glucose, Bld: 113 mg/dL — ABNORMAL HIGH (ref 70–99)
Potassium: 4.2 mEq/L (ref 3.5–5.1)
Sodium: 135 mEq/L (ref 135–145)

## 2019-06-18 NOTE — Addendum Note (Signed)
Addended by: Mervin Kung A on: 06/18/2019 07:30 AM   Modules accepted: Orders

## 2019-07-12 ENCOUNTER — Ambulatory Visit: Payer: 59 | Admitting: Family Medicine

## 2019-07-24 ENCOUNTER — Ambulatory Visit (INDEPENDENT_AMBULATORY_CARE_PROVIDER_SITE_OTHER): Payer: 59 | Admitting: Family Medicine

## 2019-07-24 ENCOUNTER — Encounter: Payer: Self-pay | Admitting: Family Medicine

## 2019-07-24 ENCOUNTER — Other Ambulatory Visit: Payer: Self-pay

## 2019-07-24 VITALS — BP 122/86 | HR 74 | Temp 95.8°F | Ht 63.0 in | Wt 301.1 lb

## 2019-07-24 DIAGNOSIS — Z1159 Encounter for screening for other viral diseases: Secondary | ICD-10-CM

## 2019-07-24 DIAGNOSIS — Z23 Encounter for immunization: Secondary | ICD-10-CM | POA: Diagnosis not present

## 2019-07-24 DIAGNOSIS — Z0000 Encounter for general adult medical examination without abnormal findings: Secondary | ICD-10-CM | POA: Diagnosis not present

## 2019-07-24 LAB — CBC
HCT: 33 % — ABNORMAL LOW (ref 36.0–46.0)
Hemoglobin: 10.6 g/dL — ABNORMAL LOW (ref 12.0–15.0)
MCHC: 32 g/dL (ref 30.0–36.0)
MCV: 73.5 fl — ABNORMAL LOW (ref 78.0–100.0)
Platelets: 552 10*3/uL — ABNORMAL HIGH (ref 150.0–400.0)
RBC: 4.5 Mil/uL (ref 3.87–5.11)
RDW: 17.9 % — ABNORMAL HIGH (ref 11.5–15.5)
WBC: 12.1 10*3/uL — ABNORMAL HIGH (ref 4.0–10.5)

## 2019-07-24 LAB — COMPREHENSIVE METABOLIC PANEL
ALT: 8 U/L (ref 0–35)
AST: 9 U/L (ref 0–37)
Albumin: 4.1 g/dL (ref 3.5–5.2)
Alkaline Phosphatase: 85 U/L (ref 39–117)
BUN: 13 mg/dL (ref 6–23)
CO2: 29 mEq/L (ref 19–32)
Calcium: 9.2 mg/dL (ref 8.4–10.5)
Chloride: 99 mEq/L (ref 96–112)
Creatinine, Ser: 0.83 mg/dL (ref 0.40–1.20)
GFR: 89.61 mL/min (ref >60.00)
Glucose, Bld: 92 mg/dL (ref 70–99)
Potassium: 4.2 mEq/L (ref 3.5–5.1)
Sodium: 136 mEq/L (ref 135–145)
Total Bilirubin: 0.2 mg/dL (ref 0.2–1.2)
Total Protein: 7.8 g/dL (ref 6.0–8.3)

## 2019-07-24 LAB — LIPID PANEL
Cholesterol: 193 mg/dL (ref 0–200)
HDL: 44.6 mg/dL (ref >39.00)
LDL Cholesterol: 123 mg/dL — ABNORMAL HIGH (ref 0–99)
NonHDL: 148.09
Total CHOL/HDL Ratio: 4
Triglycerides: 124 mg/dL (ref 0.0–149.0)
VLDL: 24.8 mg/dL (ref 0.0–40.0)

## 2019-07-24 LAB — IBC + FERRITIN
Ferritin: 6.8 ng/mL — ABNORMAL LOW (ref 10.0–291.0)
Iron: 20 ug/dL — ABNORMAL LOW (ref 42–145)
Saturation Ratios: 4.1 % — ABNORMAL LOW (ref 20.0–50.0)
Transferrin: 348 mg/dL (ref 212.0–360.0)

## 2019-07-24 NOTE — Patient Instructions (Addendum)
Give [place] 2-3 business days to get the results of your labs back.   Keep the diet clean and stay active.  Strong work with your weight loss!  Coping skills Choose 5 that work for you:  Take a deep breath  Count to 20  Read a book  Do a puzzle  Meditate  Bake  Sing  Knit  Garden  Pray  Go outside  Call a friend  Listen to music  Take a walk  Color  Send a note  Take a bath  Watch a movie  Be alone in a quiet place  Pet an animal  Visit a friend  Journal  Exercise  Stretch   [email or web address]  Goal weight in Dec, 2021: 280-285 lbs.   Let us know if you need anything.

## 2019-07-24 NOTE — Progress Notes (Signed)
Chief Complaint  Patient presents with  . Annual Exam     Well Woman Vicki Lawrence is here for a complete physical.   Her last physical was >1 year ago.  Current diet: in general, a "healthy" diet. Current exercise: walking. Weight is intentionally decreasing and she denies out of the ordinary fatigue. Seatbelt? Yes  Health Maintenance Pap/HPV- Yes Mammogram- Yes Tetanus- No Hep C screening- No HIV screening- Yes  Past Medical History:  Diagnosis Date  . Ectopic pregnancy without intrauterine pregnancy   . Headache   . Hypertension   . Migraines      Past Surgical History:  Procedure Laterality Date  . ECTOPIC PREGNANCY SURGERY  1992   Left salpingectomy per patient    Medications  Current Outpatient Medications on File Prior to Visit  Medication Sig Dispense Refill  . hydrochlorothiazide (HYDRODIURIL) 25 MG tablet Take 1 tablet (25 mg total) by mouth daily. 90 tablet 2  . lisinopril (ZESTRIL) 20 MG tablet Take 1 tablet (20 mg total) by mouth daily. 30 tablet 3  . Multiple Vitamin (MULTIVITAMIN) tablet Take 1 tablet by mouth daily.     Allergies No Known Allergies  Review of Systems: Constitutional:  no unexpected weight changes Eye:  no recent significant change in vision Ear/Nose/Mouth/Throat:  Ears:  no recent change in hearing Nose/Mouth/Throat:  no complaints of nasal congestion, no sore throat Cardiovascular: no chest pain Respiratory:  no shortness of breath Gastrointestinal:  no abdominal pain, no change in bowel habits GU:  Female: negative for dysuria or pelvic pain Musculoskeletal/Extremities:  no pain of the joints Integumentary (Skin/Breast):  no abnormal skin lesions reported Neurologic:  no headaches Endocrine:  denies fatigue Hematologic/Lymphatic:  No areas of easy bleeding  Exam BP 122/86 (BP Location: Left Arm, Patient Position: Sitting, Cuff Size: Large)   Pulse 74   Temp (!) 95.8 F (35.4 C) (Temporal)   Ht 5\' 3"  (1.6 m)    Wt (!) 301 lb 2 oz (136.6 kg)   SpO2 97%   BMI 53.34 kg/m  General:  well developed, well nourished, in no apparent distress Skin:  no significant moles, warts, or growths Head:  no masses, lesions, or tenderness Eyes:  pupils equal and round, sclera anicteric without injection Ears:  canals without lesions, TMs shiny without retraction, no obvious effusion, no erythema Nose:  nares patent, septum midline, mucosa normal, and no drainage or sinus tenderness Throat/Pharynx:  lips and gingiva without lesion; tongue and uvula midline; non-inflamed pharynx; no exudates or postnasal drainage Neck: neck supple without adenopathy, thyromegaly, or masses Lungs:  clear to auscultation, breath sounds equal bilaterally, no respiratory distress Cardio:  regular rate and rhythm, no LE edema Abdomen:  abdomen soft, nontender; bowel sounds normal; no masses or organomegaly Genital: Defer to GYN Musculoskeletal:  symmetrical muscle groups noted without atrophy or deformity Extremities:  no clubbing, cyanosis, or edema, no deformities, no skin discoloration Neuro:  gait normal; deep tendon reflexes normal and symmetric Psych: well oriented with normal range of affect and appropriate judgment/insight  Assessment and Plan  Well adult exam - Plan: CBC, Comprehensive metabolic panel, Lipid panel, IBC + Ferritin  Encounter for hepatitis C screening test for low risk patient - Plan: Hepatitis C antibody  Morbid obesity ([place])   Well 44 y.o. female. Counseled on diet and exercise. Doing well losing wt. Goal wt in Dec 2021 is 280-285 lbs. Discussed bariatric surgery and non-surg med wt loss.  Tdap today. Screen Hep C in low risk  pt.  Other orders as above. Follow up in 6 mo for HTN ck. The patient voiced understanding and agreement to the plan.  Jilda Roche [place], DO 07/24/19 8:18 AM

## 2019-07-24 NOTE — Addendum Note (Signed)
Addended by: Scharlene Gloss B on: 07/24/2019 08:30 AM   Modules accepted: Orders

## 2019-07-25 LAB — HEPATITIS C ANTIBODY
Hepatitis C Ab: NONREACTIVE
SIGNAL TO CUT-OFF: 0.26 (ref <1.00)

## 2019-07-26 ENCOUNTER — Other Ambulatory Visit: Payer: Self-pay | Admitting: Family Medicine

## 2019-07-26 DIAGNOSIS — D508 Other iron deficiency anemias: Secondary | ICD-10-CM

## 2019-07-29 ENCOUNTER — Telehealth: Payer: Self-pay | Admitting: Family

## 2019-07-29 NOTE — Telephone Encounter (Signed)
lmom to inform patient of new referral appointment 7/23 at 1015 am

## 2019-08-07 ENCOUNTER — Ambulatory Visit
Admission: RE | Admit: 2019-08-07 | Discharge: 2019-08-07 | Disposition: A | Discharge status: Home / Self Care | Payer: 59 | Source: Ambulatory Visit | Attending: Family Medicine | Admitting: Family Medicine

## 2019-08-07 ENCOUNTER — Other Ambulatory Visit: Payer: Self-pay

## 2019-08-07 DIAGNOSIS — R928 Other abnormal and inconclusive findings on diagnostic imaging of breast: Secondary | ICD-10-CM

## 2019-08-29 ENCOUNTER — Other Ambulatory Visit: Payer: Self-pay | Admitting: Family

## 2019-08-29 DIAGNOSIS — D649 Anemia, unspecified: Secondary | ICD-10-CM

## 2019-08-30 ENCOUNTER — Inpatient Hospital Stay: Payer: 59 | Attending: Family | Admitting: Family

## 2019-08-30 ENCOUNTER — Other Ambulatory Visit: Payer: Self-pay

## 2019-08-30 ENCOUNTER — Encounter: Payer: Self-pay | Admitting: Family

## 2019-08-30 ENCOUNTER — Inpatient Hospital Stay: Payer: 59

## 2019-08-30 VITALS — BP 88/43 | HR 84 | Temp 98.0°F | Resp 18 | Ht 63.0 in | Wt 300.0 lb

## 2019-08-30 DIAGNOSIS — K649 Unspecified hemorrhoids: Secondary | ICD-10-CM | POA: Diagnosis not present

## 2019-08-30 DIAGNOSIS — K5900 Constipation, unspecified: Secondary | ICD-10-CM

## 2019-08-30 DIAGNOSIS — Z803 Family history of malignant neoplasm of breast: Secondary | ICD-10-CM | POA: Diagnosis not present

## 2019-08-30 DIAGNOSIS — Z8042 Family history of malignant neoplasm of prostate: Secondary | ICD-10-CM | POA: Diagnosis not present

## 2019-08-30 DIAGNOSIS — Z87891 Personal history of nicotine dependence: Secondary | ICD-10-CM | POA: Diagnosis not present

## 2019-08-30 DIAGNOSIS — E611 Iron deficiency: Secondary | ICD-10-CM | POA: Diagnosis not present

## 2019-08-30 DIAGNOSIS — Z806 Family history of leukemia: Secondary | ICD-10-CM

## 2019-08-30 DIAGNOSIS — D509 Iron deficiency anemia, unspecified: Secondary | ICD-10-CM | POA: Insufficient documentation

## 2019-08-30 DIAGNOSIS — D649 Anemia, unspecified: Secondary | ICD-10-CM

## 2019-08-30 LAB — CMP (CANCER CENTER ONLY)
ALT: 8 U/L (ref 0–44)
AST: 9 U/L — ABNORMAL LOW (ref 15–41)
Albumin: 4.3 g/dL (ref 3.5–5.0)
Alkaline Phosphatase: 71 U/L (ref 38–126)
Anion gap: 10 (ref 5–15)
BUN: 12 mg/dL (ref 6–20)
CO2: 26 mmol/L (ref 22–32)
Calcium: 10 mg/dL (ref 8.9–10.3)
Chloride: 103 mmol/L (ref 98–111)
Creatinine: 0.94 mg/dL (ref 0.44–1.00)
GFR, Est AFR Am: >60 mL/min (ref >60)
GFR, Estimated: >60 mL/min (ref >60)
Glucose, Bld: 92 mg/dL (ref 70–99)
Potassium: 4 mmol/L (ref 3.5–5.1)
Sodium: 139 mmol/L (ref 135–145)
Total Bilirubin: 0.3 mg/dL (ref 0.3–1.2)
Total Protein: 8.6 g/dL — ABNORMAL HIGH (ref 6.5–8.1)

## 2019-08-30 LAB — CBC WITH DIFFERENTIAL (CANCER CENTER ONLY)
Abs Immature Granulocytes: 0.16 10*3/uL — ABNORMAL HIGH (ref 0.00–0.07)
Basophils Absolute: 0.1 10*3/uL (ref 0.0–0.1)
Basophils Relative: 0 %
Eosinophils Absolute: 0.3 10*3/uL (ref 0.0–0.5)
Eosinophils Relative: 2 %
HCT: 34.5 % — ABNORMAL LOW (ref 36.0–46.0)
Hemoglobin: 10.7 g/dL — ABNORMAL LOW (ref 12.0–15.0)
Immature Granulocytes: 1 %
Lymphocytes Relative: 31 %
Lymphs Abs: 4 10*3/uL (ref 0.7–4.0)
MCH: 22.9 pg — ABNORMAL LOW (ref 26.0–34.0)
MCHC: 31 g/dL (ref 30.0–36.0)
MCV: 73.9 fL — ABNORMAL LOW (ref 80.0–100.0)
Monocytes Absolute: 0.8 10*3/uL (ref 0.1–1.0)
Monocytes Relative: 6 %
Neutro Abs: 7.7 10*3/uL (ref 1.7–7.7)
Neutrophils Relative %: 60 %
Platelet Count: 551 10*3/uL — ABNORMAL HIGH (ref 150–400)
RBC: 4.67 MIL/uL (ref 3.87–5.11)
RDW: 18.4 % — ABNORMAL HIGH (ref 11.5–15.5)
WBC Count: 13 10*3/uL — ABNORMAL HIGH (ref 4.0–10.5)
nRBC: 0 % (ref 0.0–0.2)

## 2019-08-30 LAB — SAVE SMEAR(SSMR), FOR PROVIDER SLIDE REVIEW

## 2019-08-30 LAB — RETICULOCYTES
Immature Retic Fract: 16.7 % — ABNORMAL HIGH (ref 2.3–15.9)
RBC.: 4.63 MIL/uL (ref 3.87–5.11)
Retic Count, Absolute: 65.3 10*3/uL (ref 19.0–186.0)
Retic Ct Pct: 1.4 % (ref 0.4–3.1)

## 2019-08-30 NOTE — Progress Notes (Signed)
Hematology/Oncology Consultation   Name: Chinmayi Rumer      MRN: [id number]    Location: Room/bed info not found  Date: 08/30/2019 Time:10:45 AM   REFERRING PHYSICIAN: Janyth Pupa P. Wendling, DO  REASON FOR CONSULT: Iron deficiency anemia    DIAGNOSIS: Iron deficiency anemia   HISTORY OF PRESENT ILLNESS: Ms. Messamore is a very pleasant 73 to [place] American female with recent diagnosis of iron deficiency.  She is symptomatic with fatigue.  Ferritin was 6.8 and iron saturation was 4% in June.  She tried taking an iron supplement but this worsened her constipation and caused an upset stomach.  She has bleeding occasionally from hemorrhoids when straining. She states that she has chronic constipation.  Her cycle is regular with a normal flow. She has 1 son and history of 2 miscarriages (both within first 8 weeks of pregnancy) and one ectopic pregnancy. No other blood loss noted. No bruising or petechiae.  No family history of anemia that she is aware of.  No personal history of cancer. Family history includes: mother - leukemia, paternal grandfather - prostate, paternal grandmother and paternal aunt - breast.  She had her first mammogram in March. She had a follow-up left axillary [place] which was normal. They recommended follow-up imaging in 1 year.  No fever, chills, n/v, cough, rash, dizziness, SOB, chills, n/v, cough, rash, dizziness, SOB, chest pain, palpitations, abdominal pain or changes in bow;e or bladder habits.  No swelling or tenderness in her extremities.  She has carpal tunnel and notes occasional numbness and tingling in her hands when on the computer with work for an extended period of time.  She states that she tripped and fell a few weeks ago but was not injured. No syncopal episodes to report.  No smoking, ETOH or recreational drug use.  She has maintained a good appetite. She does not eat red meat or pork. She feels she is staying well hydrated. Her weight is stable.   She is walking regularly for exercise.   ROS: All other 10 point review of systems is negative.   PAST MEDICAL HISTORY:   Past Medical History:  Diagnosis Date   Ectopic pregnancy without intrauterine pregnancy    Headache    Hypertension    Migraines     ALLERGIES: No Known Allergies    MEDICATIONS:  Current Outpatient Medications on File Prior to Visit  Medication Sig Dispense Refill   hydrochlorothiazide (HYDRODIURIL) 25 MG tablet Take 1 tablet (25 mg total) by mouth daily. 90 tablet 2   lisinopril (ZESTRIL) 20 MG tablet Take 1 tablet (20 mg total) by mouth daily. 30 tablet 3   Multiple Vitamin (MULTIVITAMIN) tablet Take 1 tablet by mouth daily.     No current facility-administered medications on file prior to visit.     PAST SURGICAL HISTORY Past Surgical History:  Procedure Laterality Date   ECTOPIC PREGNANCY SURGERY  1992   Left salpingectomy per patient    FAMILY HISTORY: Family History  Problem Relation Age of Onset   Cancer Paternal Grandfather        prostate   Cancer Mother 64       leukemia   Hypertension Mother    High Cholesterol Mother    Heart attack Maternal Grandmother 93    SOCIAL HISTORY:  reports that she has quit smoking. Her smoking use included cigarettes. She has never used smokeless tobacco. She reports current alcohol use. She reports that she does not use drugs.  PERFORMANCE STATUS: The patient's  performance status is 1 - Symptomatic but completely ambulatory  PHYSICAL EXAM: Most Recent Vital Signs: There were no vitals taken for this visit. BP (!) 88/43 (BP Location: Left Wrist, Patient Position: Sitting)    Pulse 84    Temp 98 F (36.7 C) (Oral)    Resp 18    Ht 5\' 3"  (1.6 m)    Wt (!) 300 lb 0.6 oz (136.1 kg)    SpO2 100%    BMI 53.15 kg/m   General Appearance:    Alert, cooperative, no distress, appears stated age  Head:    Normocephalic, without obvious abnormality, atraumatic  Eyes:    PERRL,  conjunctiva/corneas clear, EOM's intact, fundi    benign, both eyes        Throat:   Lips, mucosa, and tongue normal; teeth and gums normal  Neck:   Supple, symmetrical, trachea midline, no adenopathy;    thyroid:  no enlargement/tenderness/nodules; no carotid   bruit or JVD  Back:     Symmetric, no curvature, ROM normal, no CVA tenderness  Lungs:     Clear to auscultation bilaterally, respirations unlabored  Chest Wall:    No tenderness or deformity   Heart:    Regular rate and rhythm, S1 and S2 normal, no murmur, rub   or gallop     Abdomen:     Soft, non-tender, bowel sounds active all four quadrants,    no masses, no organomegaly        Extremities:   Extremities normal, atraumatic, no cyanosis or edema  Pulses:   2+ and symmetric all extremities  Skin:   Skin color, texture, turgor normal, no rashes or lesions  Lymph nodes:   Cervical, supraclavicular, and axillary nodes normal  Neurologic:   CNII-XII intact, normal strength, sensation and reflexes    throughout    LABORATORY DATA:  Results for orders placed or performed in visit on 08/30/19 (from the past 48 hour(s))  Save Smear (SSMR)     Status: None   Collection Time: 08/30/19 10:12 AM  Result Value Ref Range   Smear Review SMEAR STAINED AND AVAILABLE FOR REVIEW     Comment: Performed at [facility] at [facility], [address], [place], [place]  CBC with Differential (Cancer Center Only)     Status: Abnormal   Collection Time: 08/30/19 10:12 AM  Result Value Ref Range   WBC Count 13.0 (H) 4.0 - 10.5 K/uL   RBC 4.67 3.87 - 5.11 MIL/uL   Hemoglobin 10.7 (L) 12.0 - 15.0 g/dL    Comment: Reticulocyte Hemoglobin testing may be clinically indicated, consider ordering this additional test 09/01/19    HCT 34.5 (L) 36 - 46 %   MCV 73.9 (L) 80.0 - 100.0 fL   MCH 22.9 (L) 26.0 - 34.0 pg   MCHC 31.0 30.0 - 36.0 g/dL   RDW [id number] (H) 25.3 - 66.4 %   Platelet Count 551 (H) 150 - 400  K/uL   nRBC 0.0 0.0 - 0.2 %   Neutrophils Relative % 60 %   Neutro Abs 7.7 1.7 - 7.7 K/uL   Lymphocytes Relative 31 %   Lymphs Abs 4.0 0.7 - 4.0 K/uL   Monocytes Relative 6 %   Monocytes Absolute 0.8 0 - 1 K/uL   Eosinophils Relative 2 %   Eosinophils Absolute 0.3 0 - 0 K/uL   Basophils Relative 0 %   Basophils Absolute 0.1 0 - 0 K/uL  Immature Granulocytes 1 %   Abs Immature Granulocytes 0.16 (H) 0.00 - 0.07 K/uL    Comment: Performed at [facility] at [facility], [address] [place], [place] [address]  Reticulocytes     Status: Abnormal   Collection Time: 08/30/19 10:13 AM  Result Value Ref Range   Retic Ct Pct 1.4 0.4 - 3.1 %   RBC. 4.63 3.87 - 5.11 MIL/uL   Retic Count, Absolute 65.3 19.0 - 186.0 K/uL   Immature Retic Fract 16.7 (H) 2.3 - 15.9 %    Comment: Performed at [facility] at [facility], [address], [place], [place] [address]      RADIOGRAPHY: No results found.     PATHOLOGY: None  ASSESSMENT/PLAN:  Ms. Blakeney is a very pleasant 34 to [place] American female with recent diagnosis of iron deficiency. Unfortunately she failed oral iron.  We will see what her iron studies look like and bring her back in for infusion if needed.  We also checked a hemoglobinopathy eval and alpha thalassemia as well. We will go ahead and plan to see her back in another 6 weeks.   All questions were answered and she is in agreement with the plan. She can contact our office with any questions or concerns. We can certainly see her sooner if needed.   She was discussed with and also seen by Dr. Myna Hidalgo and he is in agreement with the aforementioned.    Emeline Gins, NP

## 2019-09-02 ENCOUNTER — Telehealth: Payer: Self-pay | Admitting: Family

## 2019-09-02 LAB — IRON AND TIBC
Iron: 24 ug/dL — ABNORMAL LOW (ref 41–142)
Saturation Ratios: 6 % — ABNORMAL LOW (ref 21–57)
TIBC: 408 ug/dL (ref 236–444)
UIBC: 384 ug/dL (ref 120–384)

## 2019-09-02 LAB — HGB FRACTIONATION CASCADE
Hgb A2: 2.3 % (ref 1.8–3.2)
Hgb A: 97.7 % (ref 96.4–98.8)
Hgb F: 0 % (ref 0.0–2.0)
Hgb S: 0 %

## 2019-09-02 LAB — FERRITIN: Ferritin: 16 ng/mL (ref 11–307)

## 2019-09-02 LAB — ERYTHROPOIETIN: Erythropoietin: 7.7 m[IU]/mL (ref 2.6–18.5)

## 2019-09-02 NOTE — Telephone Encounter (Signed)
Appointments scheduled calendar printed & mailed per 7/23 los 

## 2019-09-03 ENCOUNTER — Other Ambulatory Visit: Payer: Self-pay | Admitting: Family

## 2019-09-03 DIAGNOSIS — D500 Iron deficiency anemia secondary to blood loss (chronic): Secondary | ICD-10-CM

## 2019-09-03 DIAGNOSIS — D509 Iron deficiency anemia, unspecified: Secondary | ICD-10-CM | POA: Insufficient documentation

## 2019-09-06 LAB — ALPHA-THALASSEMIA GENOTYPR

## 2019-09-09 ENCOUNTER — Inpatient Hospital Stay: Payer: 59 | Attending: Family

## 2019-09-09 ENCOUNTER — Ambulatory Visit: Payer: 59

## 2019-09-09 ENCOUNTER — Other Ambulatory Visit: Payer: Self-pay

## 2019-09-09 VITALS — BP 149/69 | HR 65 | Temp 98.0°F | Resp 17

## 2019-09-09 DIAGNOSIS — D509 Iron deficiency anemia, unspecified: Secondary | ICD-10-CM | POA: Diagnosis present

## 2019-09-09 DIAGNOSIS — D500 Iron deficiency anemia secondary to blood loss (chronic): Secondary | ICD-10-CM

## 2019-09-09 MED ORDER — SODIUM CHLORIDE 0.9 % IV SOLN
INTRAVENOUS | Status: DC
  Filled 2019-09-09: qty 250

## 2019-09-09 MED ORDER — SODIUM CHLORIDE 0.9 % IV SOLN
200.0000 mg | Freq: Once | INTRAVENOUS | Status: AC
  Administered 2019-09-09: 200 mg via INTRAVENOUS
  Filled 2019-09-09: qty 200

## 2019-09-09 NOTE — Patient Instructions (Signed)

## 2019-09-11 ENCOUNTER — Inpatient Hospital Stay: Payer: 59

## 2019-09-11 ENCOUNTER — Ambulatory Visit: Payer: 59

## 2019-09-11 ENCOUNTER — Other Ambulatory Visit: Payer: Self-pay

## 2019-09-11 VITALS — BP 138/79 | HR 70 | Temp 98.6°F | Resp 17

## 2019-09-11 DIAGNOSIS — D500 Iron deficiency anemia secondary to blood loss (chronic): Secondary | ICD-10-CM

## 2019-09-11 DIAGNOSIS — D509 Iron deficiency anemia, unspecified: Secondary | ICD-10-CM | POA: Diagnosis not present

## 2019-09-11 MED ORDER — SODIUM CHLORIDE 0.9 % IV SOLN
Freq: Once | INTRAVENOUS | Status: AC
  Filled 2019-09-11: qty 250

## 2019-09-11 MED ORDER — SODIUM CHLORIDE 0.9 % IV SOLN
200.0000 mg | Freq: Once | INTRAVENOUS | Status: AC
  Administered 2019-09-11: 200 mg via INTRAVENOUS
  Filled 2019-09-11: qty 200

## 2019-09-11 NOTE — Patient Instructions (Signed)

## 2019-09-13 ENCOUNTER — Ambulatory Visit: Payer: 59

## 2019-09-16 ENCOUNTER — Encounter: Payer: Self-pay | Admitting: *Deleted

## 2019-09-16 ENCOUNTER — Ambulatory Visit: Payer: 59

## 2019-09-16 ENCOUNTER — Inpatient Hospital Stay: Payer: 59

## 2019-09-16 ENCOUNTER — Other Ambulatory Visit: Payer: Self-pay

## 2019-09-16 ENCOUNTER — Other Ambulatory Visit: Payer: Self-pay | Admitting: Family

## 2019-09-16 VITALS — BP 114/74 | HR 64 | Temp 98.6°F | Resp 18

## 2019-09-16 DIAGNOSIS — D509 Iron deficiency anemia, unspecified: Secondary | ICD-10-CM | POA: Diagnosis not present

## 2019-09-16 DIAGNOSIS — D500 Iron deficiency anemia secondary to blood loss (chronic): Secondary | ICD-10-CM

## 2019-09-16 MED ORDER — SODIUM CHLORIDE 0.9 % IV SOLN
Freq: Once | INTRAVENOUS | Status: AC
  Filled 2019-09-16: qty 250

## 2019-09-16 MED ORDER — SODIUM CHLORIDE 0.9 % IV SOLN
200.0000 mg | Freq: Once | INTRAVENOUS | Status: AC
  Administered 2019-09-16: 200 mg via INTRAVENOUS
  Filled 2019-09-16: qty 200

## 2019-09-16 MED ORDER — FOLIC ACID 1 MG PO TABS: 1.0000 mg | ORAL_TABLET | Freq: Every day | ORAL | 6 refills | Status: DC

## 2019-09-16 NOTE — Patient Instructions (Signed)
Iron Sucrose injection (Venofer)  What is this medicine? IRON SUCROSE (AHY ern SOO krohs) is an iron complex. Iron is used to make healthy red blood cells, which carry oxygen and nutrients throughout the body. This medicine is used to treat iron deficiency anemia in people with chronic kidney disease. This medicine may be used for other purposes; ask your health care provider or pharmacist if you have questions. COMMON BRAND NAME(S): Venofer What should I tell my health care provider before I take this medicine? They need to know if you have any of these conditions:  anemia not caused by low iron levels  heart disease  high levels of iron in the blood  kidney disease  liver disease  an unusual or allergic reaction to iron, other medicines, foods, dyes, or preservatives  pregnant or trying to get pregnant  breast-feeding How should I use this medicine? This medicine is for infusion into a vein. It is given by a health care professional in a hospital or clinic setting. Talk to your pediatrician regarding the use of this medicine in children. While this drug may be prescribed for children as young as 2 years for selected conditions, precautions do apply. Overdosage: If you think you have taken too much of this medicine contact a poison control center or emergency room at once. NOTE: This medicine is only for you. Do not share this medicine with others. What if I miss a dose? It is important not to miss your dose. Call your doctor or health care professional if you are unable to keep an appointment. What may interact with this medicine? Do not take this medicine with any of the following medications:  deferoxamine  dimercaprol  other iron products This medicine may also interact with the following medications:  chloramphenicol  deferasirox This list may not describe all possible interactions. Give your health care provider a list of all the medicines, herbs, non-prescription  drugs, or dietary supplements you use. Also tell them if you smoke, drink alcohol, or use illegal drugs. Some items may interact with your medicine. What should I watch for while using this medicine? Visit your doctor or healthcare professional regularly. Tell your doctor or healthcare professional if your symptoms do not start to get better or if they get worse. You may need blood work done while you are taking this medicine. You may need to follow a special diet. Talk to your doctor. Foods that contain iron include: whole grains/cereals, dried fruits, beans, or peas, leafy green vegetables, and organ meats (liver, kidney). What side effects may I notice from receiving this medicine? Side effects that you should report to your doctor or health care professional as soon as possible:  allergic reactions like skin rash, itching or hives, swelling of the face, lips, or tongue  breathing problems  changes in blood pressure  cough  fast, irregular heartbeat  feeling faint or lightheaded, falls  fever or chills  flushing, sweating, or hot feelings  joint or muscle aches/pains  seizures  swelling of the ankles or feet  unusually weak or tired Side effects that usually do not require medical attention (report to your doctor or health care professional if they continue or are bothersome):  diarrhea  feeling achy  headache  irritation at site where injected  nausea, vomiting  stomach upset  tiredness This list may not describe all possible side effects. Call your doctor for medical advice about side effects. You may report side effects to FDA at 1-800-FDA-1088. Where should   I keep my medicine? This drug is given in a hospital or clinic and will not be stored at home. NOTE: This sheet is a summary. It may not cover all possible information. If you have questions about this medicine, talk to your doctor, pharmacist, or health care provider.  2020 Elsevier/Gold Standard  (2010-11-04 17:14:35)  

## 2019-09-18 ENCOUNTER — Ambulatory Visit: Payer: 59

## 2019-09-18 ENCOUNTER — Other Ambulatory Visit: Payer: Self-pay

## 2019-09-18 ENCOUNTER — Inpatient Hospital Stay: Payer: 59

## 2019-09-18 VITALS — BP 134/87 | HR 75 | Temp 98.6°F | Resp 18

## 2019-09-18 DIAGNOSIS — D500 Iron deficiency anemia secondary to blood loss (chronic): Secondary | ICD-10-CM

## 2019-09-18 DIAGNOSIS — D509 Iron deficiency anemia, unspecified: Secondary | ICD-10-CM | POA: Diagnosis not present

## 2019-09-18 MED ORDER — SODIUM CHLORIDE 0.9% FLUSH
10.0000 mL | Freq: Once | INTRAVENOUS | Status: DC | PRN
  Filled 2019-09-18: qty 10

## 2019-09-18 MED ORDER — SODIUM CHLORIDE 0.9% FLUSH
3.0000 mL | Freq: Once | INTRAVENOUS | Status: DC | PRN
  Filled 2019-09-18: qty 10

## 2019-09-18 MED ORDER — SODIUM CHLORIDE 0.9 % IV SOLN
Freq: Once | INTRAVENOUS | Status: AC
  Filled 2019-09-18: qty 250

## 2019-09-18 MED ORDER — SODIUM CHLORIDE 0.9 % IV SOLN
200.0000 mg | Freq: Once | INTRAVENOUS | Status: AC
  Administered 2019-09-18: 200 mg via INTRAVENOUS
  Filled 2019-09-18: qty 10

## 2019-09-18 NOTE — Progress Notes (Signed)
Patient declined to stay for the post infusion observation period. Patient denies any difficulty with this infusion in the past and is aware to call with any questions or concerns.   Pt verbalized understanding and had no further questions today.   

## 2019-09-18 NOTE — Patient Instructions (Signed)
Iron Sucrose injection (Venofer)  What is this medicine? IRON SUCROSE (AHY ern SOO krohs) is an iron complex. Iron is used to make healthy red blood cells, which carry oxygen and nutrients throughout the body. This medicine is used to treat iron deficiency anemia in people with chronic kidney disease. This medicine may be used for other purposes; ask your health care provider or pharmacist if you have questions. COMMON BRAND NAME(S): Venofer What should I tell my health care provider before I take this medicine? They need to know if you have any of these conditions:  anemia not caused by low iron levels  heart disease  high levels of iron in the blood  kidney disease  liver disease  an unusual or allergic reaction to iron, other medicines, foods, dyes, or preservatives  pregnant or trying to get pregnant  breast-feeding How should I use this medicine? This medicine is for infusion into a vein. It is given by a health care professional in a hospital or clinic setting. Talk to your pediatrician regarding the use of this medicine in children. While this drug may be prescribed for children as young as 2 years for selected conditions, precautions do apply. Overdosage: If you think you have taken too much of this medicine contact a poison control center or emergency room at once. NOTE: This medicine is only for you. Do not share this medicine with others. What if I miss a dose? It is important not to miss your dose. Call your doctor or health care professional if you are unable to keep an appointment. What may interact with this medicine? Do not take this medicine with any of the following medications:  deferoxamine  dimercaprol  other iron products This medicine may also interact with the following medications:  chloramphenicol  deferasirox This list may not describe all possible interactions. Give your health care provider a list of all the medicines, herbs, non-prescription  drugs, or dietary supplements you use. Also tell them if you smoke, drink alcohol, or use illegal drugs. Some items may interact with your medicine. What should I watch for while using this medicine? Visit your doctor or healthcare professional regularly. Tell your doctor or healthcare professional if your symptoms do not start to get better or if they get worse. You may need blood work done while you are taking this medicine. You may need to follow a special diet. Talk to your doctor. Foods that contain iron include: whole grains/cereals, dried fruits, beans, or peas, leafy green vegetables, and organ meats (liver, kidney). What side effects may I notice from receiving this medicine? Side effects that you should report to your doctor or health care professional as soon as possible:  allergic reactions like skin rash, itching or hives, swelling of the face, lips, or tongue  breathing problems  changes in blood pressure  cough  fast, irregular heartbeat  feeling faint or lightheaded, falls  fever or chills  flushing, sweating, or hot feelings  joint or muscle aches/pains  seizures  swelling of the ankles or feet  unusually weak or tired Side effects that usually do not require medical attention (report to your doctor or health care professional if they continue or are bothersome):  diarrhea  feeling achy  headache  irritation at site where injected  nausea, vomiting  stomach upset  tiredness This list may not describe all possible side effects. Call your doctor for medical advice about side effects. You may report side effects to FDA at 1-800-FDA-1088. Where should   I keep my medicine? This drug is given in a hospital or clinic and will not be stored at home. NOTE: This sheet is a summary. It may not cover all possible information. If you have questions about this medicine, talk to your doctor, pharmacist, or health care provider.  2020 Elsevier/Gold Standard  (2010-11-04 17:14:35)  

## 2019-09-23 ENCOUNTER — Inpatient Hospital Stay: Payer: 59

## 2019-09-23 ENCOUNTER — Other Ambulatory Visit: Payer: Self-pay

## 2019-09-23 VITALS — BP 149/87 | HR 67 | Temp 98.7°F | Resp 17

## 2019-09-23 DIAGNOSIS — D500 Iron deficiency anemia secondary to blood loss (chronic): Secondary | ICD-10-CM

## 2019-09-23 DIAGNOSIS — D509 Iron deficiency anemia, unspecified: Secondary | ICD-10-CM | POA: Diagnosis not present

## 2019-09-23 MED ORDER — SODIUM CHLORIDE 0.9 % IV SOLN
200.0000 mg | Freq: Once | INTRAVENOUS | Status: AC
  Administered 2019-09-23: 200 mg via INTRAVENOUS
  Filled 2019-09-23: qty 200

## 2019-09-23 MED ORDER — SODIUM CHLORIDE 0.9 % IV SOLN
Freq: Once | INTRAVENOUS | Status: AC
  Filled 2019-09-23: qty 250

## 2019-09-26 ENCOUNTER — Encounter: Payer: Self-pay | Admitting: Hematology & Oncology

## 2019-10-11 ENCOUNTER — Inpatient Hospital Stay: Payer: 59 | Admitting: Family

## 2019-10-11 ENCOUNTER — Inpatient Hospital Stay: Payer: 59

## 2019-10-23 ENCOUNTER — Inpatient Hospital Stay: Payer: 59 | Attending: Family

## 2019-10-23 ENCOUNTER — Inpatient Hospital Stay: Payer: 59 | Admitting: Family

## 2020-01-16 ENCOUNTER — Telehealth: Payer: Self-pay | Admitting: Family Medicine

## 2020-01-16 DIAGNOSIS — I10 Essential (primary) hypertension: Secondary | ICD-10-CM

## 2020-01-16 MED ORDER — LISINOPRIL 20 MG PO TABS: 20.0000 mg | ORAL_TABLET | Freq: Every day | ORAL | 3 refills | Status: DC

## 2020-01-16 NOTE — Telephone Encounter (Signed)
Medication refilled

## 2020-01-16 NOTE — Telephone Encounter (Signed)
Medication: lisinopril (ZESTRIL) 20 MG tablet [[id number]]       Has the patient contacted their pharmacy?  (If no, request that the patient contact the pharmacy for the refill.) (If yes, when and what did the pharmacy advise?)     Preferred Pharmacy (with phone number or street name): [facility] [phone] [place], [place] [phone] [address] [facility] [address] AT [facility] [address]  [address] [place] [address]  Phone:  [phone] Fax:  [phone]  DEA #:  [id number]     Agent: Please be advised that RX refills may take up to 3 business days. We ask that you follow-up with your pharmacy.

## 2020-01-28 ENCOUNTER — Encounter: Payer: Self-pay | Admitting: Family Medicine

## 2020-01-28 ENCOUNTER — Other Ambulatory Visit: Payer: Self-pay

## 2020-01-28 ENCOUNTER — Ambulatory Visit: Payer: 59 | Admitting: Family Medicine

## 2020-01-28 VITALS — BP 140/86 | HR 88 | Temp 98.2°F | Ht 63.0 in | Wt 308.4 lb

## 2020-01-28 DIAGNOSIS — I10 Essential (primary) hypertension: Secondary | ICD-10-CM

## 2020-01-28 MED ORDER — LISINOPRIL-HYDROCHLOROTHIAZIDE 20-25 MG PO TABS: 1.0000 | ORAL_TABLET | Freq: Every day | ORAL | 3 refills | Status: DC

## 2020-01-28 NOTE — Patient Instructions (Addendum)
Consider taking selfies of yourself in the mirror once a month. Make sure you are the same distance from the mirror.   Keep the diet clean and stay active.  Keep an eye on your blood pressure 3-4 times per week over the next 2-3 weeks. Bring your readings and blood pressure monitor to your nurse visit.   Let us know if you need anything.

## 2020-01-28 NOTE — Progress Notes (Signed)
Chief Complaint  Patient presents with  . Follow-up    Subjective Vicki Lawrence is a 44 y.o. female who presents for hypertension follow up. She does monitor home blood pressures. Blood pressures ranging from 130's/70's on average. She is compliant with medications. Patient has these side effects of medication: none She is adhering to a healthy diet overall. Current exercise: walking No CP or SOB.    Past Medical History:  Diagnosis Date  . Ectopic pregnancy without intrauterine pregnancy   . Headache   . Hypertension   . Migraines     Exam BP 140/86 (BP Location: Left Arm, Patient Position: Sitting, Cuff Size: Large)   Pulse 88   Temp 98.2 F (36.8 C) (Oral)   Ht 5\' 3"  (1.6 m)   Wt (!) 308 lb 6 oz (139.9 kg)   SpO2 99%   BMI 54.63 kg/m  General:  well developed, well nourished, in no apparent distress Heart: RRR, no bruits, no LE edema Lungs: clear to auscultation, no accessory muscle use Psych: well oriented with normal range of affect and appropriate judgment/insight  Essential hypertension - Plan: lisinopril-hydrochlorothiazide (ZESTORETIC) 20-25 MG tablet  Not controlled today. Will send in combo pill. Monitor BP at home. Bring monitor and readings in 2-3 weeks for nurse visit. If elevated, would add Norvasc 5 mg/d and f/u in 2-3 weeks. If nml, f/u as originally scheduled for CPE In 6 mo. Counseled on diet and exercise. The patient voiced understanding and agreement to the plan.  [place], DO 01/28/20  7:59 AM

## 2020-02-13 ENCOUNTER — Ambulatory Visit: Payer: 59

## 2020-02-20 ENCOUNTER — Telehealth: Payer: 59

## 2020-03-11 ENCOUNTER — Other Ambulatory Visit: Payer: Self-pay | Admitting: Family Medicine

## 2020-03-11 DIAGNOSIS — I10 Essential (primary) hypertension: Secondary | ICD-10-CM

## 2020-06-06 ENCOUNTER — Other Ambulatory Visit: Payer: Self-pay | Admitting: Family

## 2020-06-06 DIAGNOSIS — D500 Iron deficiency anemia secondary to blood loss (chronic): Secondary | ICD-10-CM

## 2020-07-27 ENCOUNTER — Ambulatory Visit (INDEPENDENT_AMBULATORY_CARE_PROVIDER_SITE_OTHER): Payer: 59 | Admitting: Family Medicine

## 2020-07-27 ENCOUNTER — Other Ambulatory Visit: Payer: Self-pay

## 2020-07-27 ENCOUNTER — Encounter: Payer: Self-pay | Admitting: Family Medicine

## 2020-07-27 ENCOUNTER — Other Ambulatory Visit: Payer: Self-pay | Admitting: Family Medicine

## 2020-07-27 VITALS — BP 128/80 | HR 78 | Temp 98.4°F | Ht 63.0 in | Wt 313.1 lb

## 2020-07-27 DIAGNOSIS — Z0000 Encounter for general adult medical examination without abnormal findings: Secondary | ICD-10-CM | POA: Diagnosis not present

## 2020-07-27 DIAGNOSIS — Z1211 Encounter for screening for malignant neoplasm of colon: Secondary | ICD-10-CM

## 2020-07-27 DIAGNOSIS — M25562 Pain in left knee: Secondary | ICD-10-CM | POA: Diagnosis not present

## 2020-07-27 DIAGNOSIS — E785 Hyperlipidemia, unspecified: Secondary | ICD-10-CM

## 2020-07-27 LAB — COMPREHENSIVE METABOLIC PANEL
ALT: 9 U/L (ref 0–35)
AST: 9 U/L (ref 0–37)
Albumin: 4.1 g/dL (ref 3.5–5.2)
Alkaline Phosphatase: 65 U/L (ref 39–117)
BUN: 15 mg/dL (ref 6–23)
CO2: 28 mEq/L (ref 19–32)
Calcium: 9.4 mg/dL (ref 8.4–10.5)
Chloride: 103 mEq/L (ref 96–112)
Creatinine, Ser: 0.86 mg/dL (ref 0.40–1.20)
GFR: 80.77 mL/min (ref >60.00)
Glucose, Bld: 94 mg/dL (ref 70–99)
Potassium: 4.1 mEq/L (ref 3.5–5.1)
Sodium: 139 mEq/L (ref 135–145)
Total Bilirubin: 0.2 mg/dL (ref 0.2–1.2)
Total Protein: 7.6 g/dL (ref 6.0–8.3)

## 2020-07-27 LAB — LIPID PANEL
Cholesterol: 219 mg/dL — ABNORMAL HIGH (ref 0–200)
HDL: 38.6 mg/dL — ABNORMAL LOW (ref >39.00)
LDL Cholesterol: 141 mg/dL — ABNORMAL HIGH (ref 0–99)
NonHDL: 180.83
Total CHOL/HDL Ratio: 6
Triglycerides: 199 mg/dL — ABNORMAL HIGH (ref 0.0–149.0)
VLDL: 39.8 mg/dL (ref 0.0–40.0)

## 2020-07-27 LAB — CBC
HCT: 33.9 % — ABNORMAL LOW (ref 36.0–46.0)
Hemoglobin: 11.1 g/dL — ABNORMAL LOW (ref 12.0–15.0)
MCHC: 32.7 g/dL (ref 30.0–36.0)
MCV: 81.6 fl (ref 78.0–100.0)
Platelets: 504 10*3/uL — ABNORMAL HIGH (ref 150.0–400.0)
RBC: 4.16 Mil/uL (ref 3.87–5.11)
RDW: 15.1 % (ref 11.5–15.5)
WBC: 11.1 10*3/uL — ABNORMAL HIGH (ref 4.0–10.5)

## 2020-07-27 NOTE — Patient Instructions (Addendum)
Give [place] 2-3 business days to get the results of your labs back.   Keep the diet clean and stay active.  Aim to do some physical exertion for 150 minutes per week. This is typically divided into 5 days per week, 30 minutes per day. The activity should be enough to get your heart rate up. Anything is better than nothing if you have time constraints.  If you do not hear anything about your referrals in the next 1-2 weeks, call our office and ask for an update.  Let us know if you need anything.

## 2020-07-27 NOTE — Progress Notes (Signed)
Chief Complaint  Patient presents with   Annual Exam     Well Woman Brisa Auth is here for a complete physical.   Her last physical was >1 year ago.  Current diet: in general, a "healthy" diet. Current exercise: has weaned down w walking due to L knee pain. Weight is slightly increased and she confirms. Seatbelt? Yes  Health Maintenance Pap/HPV- Yes Mammogram- Yes Tetanus- Yes Hep C screening- Yes HIV screening- Yes  L knee pain 1 mo w/o inj or change in activity. Feels tight and pain in the front. Unchanging. No neuro s/s's. No catching or locking. No bruising, swelling, redness. ROM intact. Tried stretching the knee. +famhx of OA in knees with mom, dx'd in 30's.   Past Medical History:  Diagnosis Date   Ectopic pregnancy without intrauterine pregnancy    Headache    Hypertension    Migraines      Past Surgical History:  Procedure Laterality Date   ECTOPIC PREGNANCY SURGERY  1992   Left salpingectomy per patient    Medications  Current Outpatient Medications on File Prior to Visit  Medication Sig Dispense Refill   folic acid (FOLVITE) 1 MG tablet TAKE 1 TABLET(1 MG) BY MOUTH DAILY 30 tablet 6   lisinopril-hydrochlorothiazide (ZESTORETIC) 20-25 MG tablet Take 1 tablet by mouth daily. 90 tablet 3   Multiple Vitamin (MULTIVITAMIN) tablet Take 1 tablet by mouth daily.      Allergies No Known Allergies  Review of Systems: Constitutional:  no unexpected weight changes Eye:  no recent significant change in vision Ear/Nose/Mouth/Throat:  Ears:  no recent change in hearing Nose/Mouth/Throat:  no complaints of nasal congestion, no sore throat Cardiovascular: no chest pain Respiratory:  no shortness of breath Gastrointestinal:  no abdominal pain, no change in bowel habits GU:  Female: negative for dysuria or pelvic pain Musculoskeletal/Extremities:  +L knee pain Integumentary (Skin/Breast):  no abnormal skin lesions reported Neurologic:  no  headaches Endocrine:  + fatigue Hematologic/Lymphatic:  No areas of easy bleeding  Exam BP 128/80   Pulse 78   Temp 98.4 F (36.9 C) (Oral)   Ht 5\' 3"  (1.6 m)   Wt (!) 313 lb 2 oz (142 kg)   SpO2 99%   BMI 55.47 kg/m  General:  well developed, well nourished, in no apparent distress Skin:  no significant moles, warts, or growths Head:  no masses, lesions, or tenderness Eyes:  pupils equal and round, sclera anicteric without injection Ears:  canals without lesions, TMs shiny without retraction, no obvious effusion, no erythema Nose:  nares patent, septum midline, mucosa normal, and no drainage or sinus tenderness Throat/Pharynx:  lips and gingiva without lesion; tongue and uvula midline; non-inflamed pharynx; no exudates or postnasal drainage Neck: neck supple without adenopathy, thyromegaly, or masses Lungs:  clear to auscultation, breath sounds equal bilaterally, no respiratory distress Cardio:  regular rate and rhythm, no LE edema Abdomen:  abdomen soft, nontender; bowel sounds normal; no masses or organomegaly Genital: Defer to GYN Musculoskeletal: L knee: nml active/passive ROM; +pat grind, neg patellar apprehension, Lachman's, varus/valgus stress, McMurray's; no jt line ttp, mild ttp over medial plica of fem condyle. No deformity or excessive warmth.  Extremities:  no clubbing, cyanosis, or edema, no deformities, no skin discoloration Neuro:  gait normal; deep tendon reflexes normal and symmetric Psych: well oriented with normal range of affect and appropriate judgment/insight  Assessment and Plan  Well adult exam - Plan: CBC, Comprehensive metabolic panel, Lipid panel  Morbid obesity (HCC) -  Plan: Amb Referral to Bariatric Surgery  Screen for colon cancer - Plan: Ambulatory referral to Gastroenterology  Left knee pain, unspecified chronicity   Well 45 y.o. female. Counseled on diet and exercise. Other orders as above. I think fatigue will improve w wt loss. Doubt  OA, likely PFS and mild knee plica syndrome. Ice, Tylenol, ibuprofen, stretches/exercises. Sports med if no better in 1 mo.  Follow up 6 mo or prn. The patient voiced understanding and agreement to the plan.  Jilda Roche Edwards, DO 07/27/20 7:35 AM

## 2020-09-08 ENCOUNTER — Other Ambulatory Visit: Payer: 59

## 2020-09-10 ENCOUNTER — Other Ambulatory Visit: Payer: Self-pay

## 2020-09-10 ENCOUNTER — Emergency Department (HOSPITAL_BASED_OUTPATIENT_CLINIC_OR_DEPARTMENT_OTHER)
Admission: EM | Admit: 2020-09-10 | Discharge: 2020-09-10 | Disposition: A | Discharge status: Home / Self Care | Payer: 59 | Attending: Emergency Medicine | Admitting: Emergency Medicine

## 2020-09-10 ENCOUNTER — Encounter (HOSPITAL_BASED_OUTPATIENT_CLINIC_OR_DEPARTMENT_OTHER): Payer: Self-pay | Admitting: *Deleted

## 2020-09-10 ENCOUNTER — Emergency Department (HOSPITAL_BASED_OUTPATIENT_CLINIC_OR_DEPARTMENT_OTHER): Payer: 59

## 2020-09-10 DIAGNOSIS — Z79899 Other long term (current) drug therapy: Secondary | ICD-10-CM | POA: Diagnosis not present

## 2020-09-10 DIAGNOSIS — S0083XA Contusion of other part of head, initial encounter: Secondary | ICD-10-CM | POA: Diagnosis not present

## 2020-09-10 DIAGNOSIS — I10 Essential (primary) hypertension: Secondary | ICD-10-CM | POA: Diagnosis not present

## 2020-09-10 DIAGNOSIS — W2209XA Striking against other stationary object, initial encounter: Secondary | ICD-10-CM | POA: Diagnosis not present

## 2020-09-10 DIAGNOSIS — F17210 Nicotine dependence, cigarettes, uncomplicated: Secondary | ICD-10-CM | POA: Diagnosis not present

## 2020-09-10 DIAGNOSIS — S0993XA Unspecified injury of face, initial encounter: Secondary | ICD-10-CM | POA: Diagnosis present

## 2020-09-10 DIAGNOSIS — R55 Syncope and collapse: Secondary | ICD-10-CM | POA: Diagnosis not present

## 2020-09-10 LAB — COMPREHENSIVE METABOLIC PANEL
ALT: 13 U/L (ref 0–44)
AST: 16 U/L (ref 15–41)
Albumin: 4.1 g/dL (ref 3.5–5.0)
Alkaline Phosphatase: 57 U/L (ref 38–126)
Anion gap: 9 (ref 5–15)
BUN: 11 mg/dL (ref 6–20)
CO2: 27 mmol/L (ref 22–32)
Calcium: 9.5 mg/dL (ref 8.9–10.3)
Chloride: 100 mmol/L (ref 98–111)
Creatinine, Ser: 0.83 mg/dL (ref 0.44–1.00)
GFR, Estimated: >60 mL/min (ref >60)
Glucose, Bld: 101 mg/dL — ABNORMAL HIGH (ref 70–99)
Potassium: 3.9 mmol/L (ref 3.5–5.1)
Sodium: 136 mmol/L (ref 135–145)
Total Bilirubin: 0.2 mg/dL — ABNORMAL LOW (ref 0.3–1.2)
Total Protein: 8.4 g/dL — ABNORMAL HIGH (ref 6.5–8.1)

## 2020-09-10 LAB — CBC
HCT: 35.1 % — ABNORMAL LOW (ref 36.0–46.0)
Hemoglobin: 11.4 g/dL — ABNORMAL LOW (ref 12.0–15.0)
MCH: 26.1 pg (ref 26.0–34.0)
MCHC: 32.5 g/dL (ref 30.0–36.0)
MCV: 80.3 fL (ref 80.0–100.0)
Platelets: 498 10*3/uL — ABNORMAL HIGH (ref 150–400)
RBC: 4.37 MIL/uL (ref 3.87–5.11)
RDW: 14.8 % (ref 11.5–15.5)
WBC: 12.3 10*3/uL — ABNORMAL HIGH (ref 4.0–10.5)
nRBC: 0 % (ref 0.0–0.2)

## 2020-09-10 LAB — URINALYSIS, MICROSCOPIC (REFLEX)

## 2020-09-10 LAB — URINALYSIS, ROUTINE W REFLEX MICROSCOPIC
Bilirubin Urine: NEGATIVE
Glucose, UA: NEGATIVE mg/dL
Ketones, ur: NEGATIVE mg/dL
Leukocytes,Ua: NEGATIVE
Nitrite: NEGATIVE
Protein, ur: NEGATIVE mg/dL
Specific Gravity, Urine: 1.01 (ref 1.005–1.030)
pH: 6 (ref 5.0–8.0)

## 2020-09-10 LAB — RAPID URINE DRUG SCREEN, HOSP PERFORMED
Amphetamines: NOT DETECTED
Barbiturates: NOT DETECTED
Benzodiazepines: NOT DETECTED
Cocaine: NOT DETECTED
Opiates: NOT DETECTED
Tetrahydrocannabinol: POSITIVE — AB

## 2020-09-10 LAB — CBG MONITORING, ED: Glucose-Capillary: 95 mg/dL (ref 70–99)

## 2020-09-10 LAB — TROPONIN I (HIGH SENSITIVITY): Troponin I (High Sensitivity): 2 ng/L (ref <18)

## 2020-09-10 NOTE — ED Triage Notes (Signed)
She passed out this am. She hit the right side of her face on the wooden coffee table when she fell. No hx of syncope. She is alert and oriented.

## 2020-09-10 NOTE — ED Notes (Signed)
Patient transported to CT 

## 2020-09-10 NOTE — Discharge Instructions (Addendum)
1.  Hydrate and rest for the next 24 hours. 2.  Return to the emergency department immediately if you feel you have racing or skipping of your heart, chest pain, shortness of breath or you feel like you might pass out. 3.  Make a follow-up appointment to see your doctor for recheck within the next 3 to 5 days.  You may need further testing on outpatient basis. 4.  Avoid all drugs and alcohol until you have had your follow-up appointments and any further testing needed.

## 2020-09-10 NOTE — ED Provider Notes (Signed)
[facility] EMERGENCY DEPARTMENT Provider Note   CSN: [id number] Arrival date & time: 09/10/20  1233     History No chief complaint on file.   Vicki Lawrence is a 45 y.o. female.  HPI Patient reports she was getting ready for work this morning.  She was feeling fine.  She had some breakfast and was getting dressed.  She reports without really any warning she had a passing out episode.  She did not feel like she had racing heart, lightheadedness, headache or chest pain.  She was near nightstand and struck her cheek.  She immediately awakened and was oriented to her surroundings.  She reports she did not feel like she had any symptoms afterwards.  She continued on and went to work.  After she got to work, coworkers were concerned and encouraged her to come for evaluation.  Patient reports she has not had any further symptoms.  She has not felt any chest pressure, shortness of breath, lightheadedness or palpitations.  Patient denies prior history of passing out episodes.  She reports she has been feeling well.  She reports occasional marijuana use with friends.  Occasional social alcohol use.  Occasional tobacco use.  Denies other drugs of abuse.    Past Medical History:  Diagnosis Date   Ectopic pregnancy without intrauterine pregnancy    Headache    Hypertension    Migraines     Patient Active Problem List   Diagnosis Date Noted   IDA (iron deficiency anemia) 09/03/2019   Morbid obesity (HCC) 07/24/2019   Greater trochanteric bursitis of right hip 09/12/2018   Encounter for gynecological examination with Papanicolaou smear of cervix 11/06/2017   Recurrent pregnancy loss 11/06/2017   Hypertension    Migraines    Situational anxiety 07/27/2017   Essential hypertension 07/27/2017    Past Surgical History:  Procedure Laterality Date   ECTOPIC PREGNANCY SURGERY  1992   Left salpingectomy per patient   ECTOPIC PREGNANCY SURGERY       OB History     Gravida  4    Para  1   Term  1   Preterm      AB  3   Living  1      SAB  2   IAB      Ectopic  1   Multiple      Live Births  1           Family History  Problem Relation Age of Onset   Cancer Paternal Grandfather        prostate   Cancer Mother 40       leukemia   Hypertension Mother    High Cholesterol Mother    Heart attack Maternal Grandmother 37    Social History   Tobacco Use   Smoking status: Some Days    Types: Cigarettes   Smokeless tobacco: Never   Tobacco comments:    occassional smoker (08/30/19)  Vaping Use   Vaping Use: Never used  Substance Use Topics   Alcohol use: Yes    Comment: Occasional- socially   Drug use: No    Home Medications Prior to Admission medications   Medication Sig Start Date End Date Taking? Authorizing Provider  folic acid (FOLVITE) 1 MG tablet TAKE 1 TABLET(1 MG) BY MOUTH DAILY 06/07/20  Yes [place], Brand Males, NP  lisinopril-hydrochlorothiazide (ZESTORETIC) 20-25 MG tablet Take 1 tablet by mouth daily. 01/28/20  Yes Sharlene Dory, DO  Multiple Vitamin (MULTIVITAMIN)  tablet Take 1 tablet by mouth daily.   Yes [provider]    Allergies    Patient has no known allergies.  Review of Systems   Review of Systems 10 systems reviewed and negative except as per HPI Physical Exam Updated Vital Signs BP (!) 166/89 (BP Location: Left Arm)   Pulse 83   Temp 98 F (36.7 C) (Oral)   Resp 18   Ht 5\' 3"  (1.6 m)   Wt (!) 142.9 kg   LMP 08/20/2020   SpO2 100%   BMI 55.82 kg/m   Physical Exam Constitutional:      Appearance: Normal appearance.     Comments: Clinically well in appearance.  Alert nontoxic no distress.  HENT:     Head:     Comments: Very minor contusion to left zygoma.  Faint purple ecchymosis without hematoma or abrasion    Mouth/Throat:     Mouth: Mucous membranes are moist.     Pharynx: Oropharynx is clear.  Eyes:     Extraocular Movements: Extraocular movements intact.      Conjunctiva/sclera: Conjunctivae normal.     Pupils: Pupils are equal, round, and reactive to light.  Cardiovascular:     Rate and Rhythm: Normal rate and regular rhythm.  Pulmonary:     Effort: Pulmonary effort is normal.     Breath sounds: Normal breath sounds.  Abdominal:     General: There is no distension.     Palpations: Abdomen is soft.     Tenderness: There is no abdominal tenderness. There is no guarding.  Musculoskeletal:        General: No swelling or tenderness. Normal range of motion.     Right lower leg: No edema.     Left lower leg: No edema.  Skin:    General: Skin is warm and dry.  Neurological:     General: No focal deficit present.     Mental Status: She is alert and oriented to person, place, and time.     Cranial Nerves: No cranial nerve deficit.     Sensory: No sensory deficit.     Motor: No weakness.     Coordination: Coordination normal.  Psychiatric:        Mood and Affect: Mood normal.    ED Results / Procedures / Treatments   Labs (all labs ordered are listed, but only abnormal results are displayed) Labs Reviewed  CBC - Abnormal; Notable for the following components:      Result Value   WBC 12.3 (*)    Hemoglobin 11.4 (*)    HCT 35.1 (*)    Platelets 498 (*)    All other components within normal limits  COMPREHENSIVE METABOLIC PANEL - Abnormal; Notable for the following components:   Glucose, Bld 101 (*)    Total Protein 8.4 (*)    Total Bilirubin 0.2 (*)    All other components within normal limits  URINALYSIS, ROUTINE W REFLEX MICROSCOPIC - Abnormal; Notable for the following components:   Hgb urine dipstick TRACE (*)    All other components within normal limits  RAPID URINE DRUG SCREEN, HOSP PERFORMED - Abnormal; Notable for the following components:   Tetrahydrocannabinol POSITIVE (*)    All other components within normal limits  URINALYSIS, MICROSCOPIC (REFLEX) - Abnormal; Notable for the following components:   Bacteria, UA FEW (*)     All other components within normal limits  CBG MONITORING, ED  TROPONIN I (HIGH SENSITIVITY)    EKG  EKG Interpretation  Date/Time:  Thursday September 10 2020 13:00:57 EDT Ventricular Rate:  68 PR Interval:  142 QRS Duration: 86 QT Interval:  395 QTC Calculation: 421 R Axis:   24 Text Interpretation: Sinus rhythm Low voltage, extremity and precordial leads Baseline wander in lead(s) II III aVF V1 V4 V5 V6 normal except wandering artifact, no old comparison Confirmed by Arby Barrette [phone]) on 09/10/2020 3:31:42 PM  Radiology DG Chest 2 View  Result Date: 09/10/2020 CLINICAL DATA:  Syncope EXAM: CHEST - 2 VIEW COMPARISON:  None. FINDINGS: The heart size and mediastinal contours are within normal limits.No focal airspace disease. No pleural effusion or pneumothorax.No acute osseous abnormality. IMPRESSION: No evidence of acute cardiopulmonary disease. Electronically Signed   By: Caprice Renshaw   On: 09/10/2020 14:56   CT Head Wo Contrast  Result Date: 09/10/2020 CLINICAL DATA:  Head trauma, intracranial arterial injury suspected fall/syncope. Additional history provided: Syncopal episode this morning, patient hit right side of face/head on coffee table. History of hypertension, migraines. EXAM: CT HEAD WITHOUT CONTRAST TECHNIQUE: Contiguous axial images were obtained from the base of the skull through the vertex without intravenous contrast. COMPARISON:  No pertinent prior exams available for comparison. FINDINGS: Brain: Cerebral volume is normal. There is no acute intracranial hemorrhage. No demarcated cortical infarct. No extra-axial fluid collection. No evidence of an intracranial mass. No midline shift. Partially empty sella turcica. Vascular: No hyperdense vessel. Skull: Normal. Negative for fracture or focal lesion. Sinuses/Orbits: Visualized orbits show no acute finding. Small volume frothy secretions within the left sphenoid sinus. Mild mucosal thickening within the posterior left ethmoid  air cells. Other: Trace right mastoid effusion. IMPRESSION: No evidence of acute intracranial abnormality. Partially empty sella turcica. This finding is very commonly incidental, but can be associated with idiopathic intracranial hypertension. Mild paranasal sinus disease at the imaged levels, as described. Trace right mastoid effusion. Electronically Signed   By: Jackey Loge DO   On: 09/10/2020 14:59    Procedures Procedures   Medications Ordered in ED Medications - No data to display  ED Course  I have reviewed the triage vital signs and the nursing notes.  Pertinent labs & imaging results that were available during my care of the patient were reviewed by me and considered in my medical decision making (see chart for details).    MDM Rules/Calculators/A&P                           Patient presents as outlined.  She had a syncope episode at home in the morning.  No significant prodromal symptoms.  Patient felt back to normal afterwards.  CT head does not show any acute abnormalities.  Neurologic exam is normal.  No sign of stroke or subarachnoid hemorrhage.  Patient did not have headache preceding or post.  EKG without any ischemic appearance and normal rhythm.  Troponin negative.  Description does not sound likely for ACS.  Patient only takes Zestoretic.  She is not hypotensive in the emergency department.  Possibly hypotension in the morning after taking her blood pressure medicine although she does not describe symptoms that would suggest she was persistently hypotensive in the morning.  Patient reports occasional marijuana use.  She does test positive at this time.  She reports last use was several days ago.  Not high likelihood of being contributory if last use several days ago.  At this time with unremarkable work-up and patient well will discharge for close  follow-up with PCP and strict return precautions reviewed. Final Clinical Impression(s) / ED Diagnoses Final diagnoses:  Syncope  and collapse  Contusion of face, initial encounter    Rx / DC Orders ED Discharge Orders     None        Arby Barrette, MD 09/10/20 1535

## 2020-09-17 ENCOUNTER — Other Ambulatory Visit (INDEPENDENT_AMBULATORY_CARE_PROVIDER_SITE_OTHER): Payer: 59

## 2020-09-17 ENCOUNTER — Other Ambulatory Visit: Payer: Self-pay

## 2020-09-17 DIAGNOSIS — E785 Hyperlipidemia, unspecified: Secondary | ICD-10-CM | POA: Diagnosis not present

## 2020-09-17 LAB — LIPID PANEL
Cholesterol: 230 mg/dL — ABNORMAL HIGH (ref 0–200)
HDL: 43.8 mg/dL (ref >39.00)
NonHDL: 186.28
Total CHOL/HDL Ratio: 5
Triglycerides: 219 mg/dL — ABNORMAL HIGH (ref 0.0–149.0)
VLDL: 43.8 mg/dL — ABNORMAL HIGH (ref 0.0–40.0)

## 2020-09-17 LAB — LDL CHOLESTEROL, DIRECT: Direct LDL: 151 mg/dL

## 2020-09-18 ENCOUNTER — Other Ambulatory Visit: Payer: Self-pay | Admitting: Family Medicine

## 2020-09-18 DIAGNOSIS — E785 Hyperlipidemia, unspecified: Secondary | ICD-10-CM

## 2020-09-18 MED ORDER — ROSUVASTATIN CALCIUM 20 MG PO TABS: 20.0000 mg | ORAL_TABLET | Freq: Every day | ORAL | 3 refills | Status: DC

## 2021-01-11 ENCOUNTER — Other Ambulatory Visit: Payer: Self-pay | Admitting: Family

## 2021-01-11 DIAGNOSIS — D500 Iron deficiency anemia secondary to blood loss (chronic): Secondary | ICD-10-CM

## 2021-01-27 ENCOUNTER — Ambulatory Visit: Payer: 59 | Admitting: Family Medicine

## 2021-02-10 ENCOUNTER — Other Ambulatory Visit: Payer: Self-pay | Admitting: Family Medicine

## 2021-02-10 DIAGNOSIS — I10 Essential (primary) hypertension: Secondary | ICD-10-CM

## 2021-02-10 MED ORDER — ROSUVASTATIN CALCIUM 20 MG PO TABS: 20.0000 mg | ORAL_TABLET | Freq: Every day | ORAL | 3 refills | Status: DC

## 2021-02-10 MED ORDER — LISINOPRIL-HYDROCHLOROTHIAZIDE 20-25 MG PO TABS: 1.0000 | ORAL_TABLET | Freq: Every day | ORAL | 3 refills | Status: DC

## 2021-07-07 IMAGING — MG DIGITAL SCREENING BILAT W/ TOMO W/ CAD
8 of 14 series · 8 of 40 positions shown · non-contrast
Comparison: None.

CLINICAL DATA: Screening.

EXAM:
DIGITAL SCREENING BILATERAL MAMMOGRAM WITH TOMO AND CAD

[L CC synth-2D]
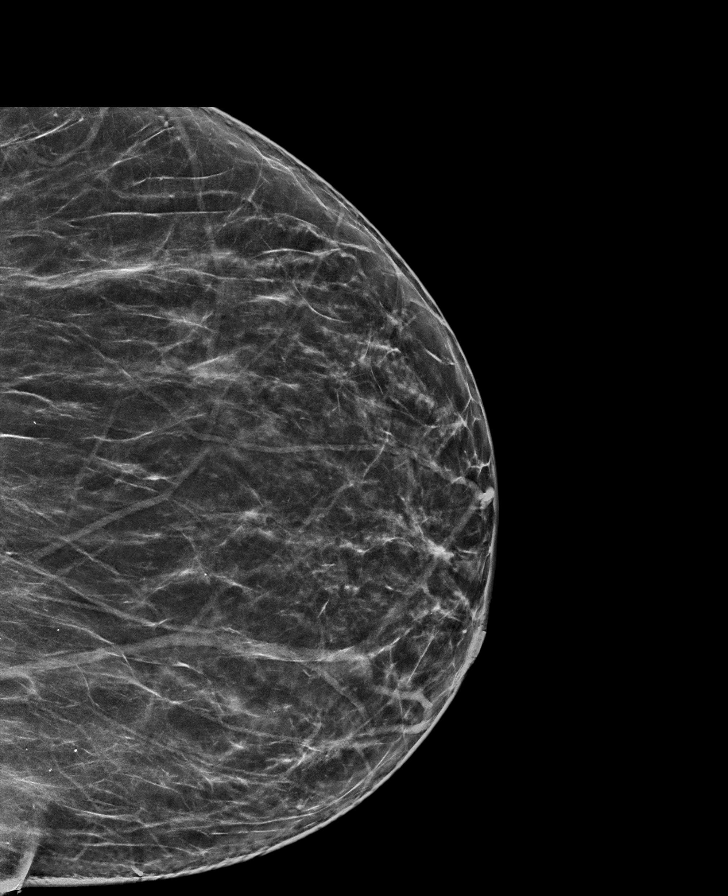

[L MLO synth-2D (1 of 2)]
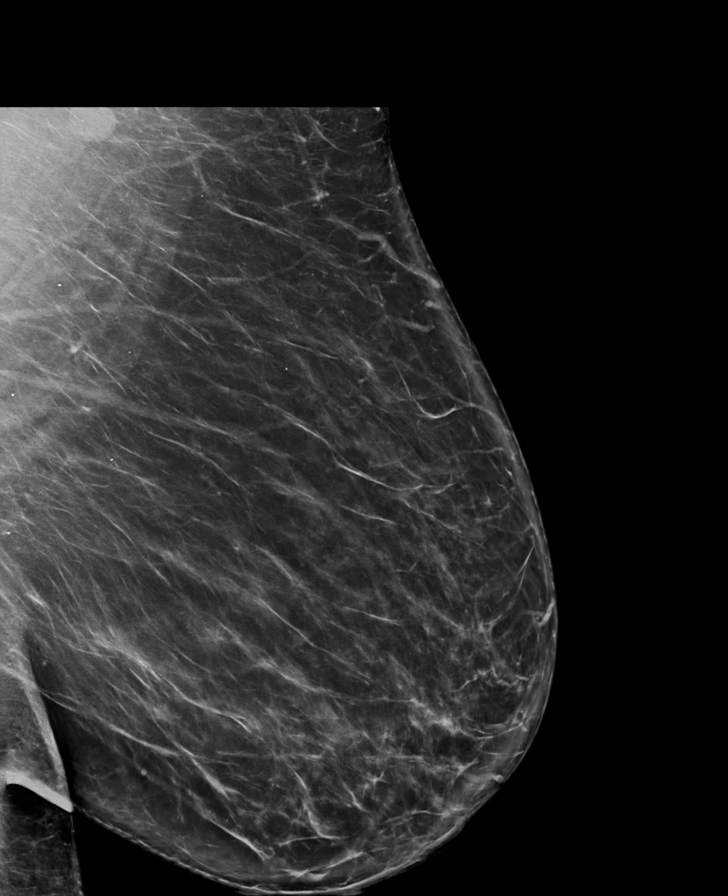

[R CC synth-2D]
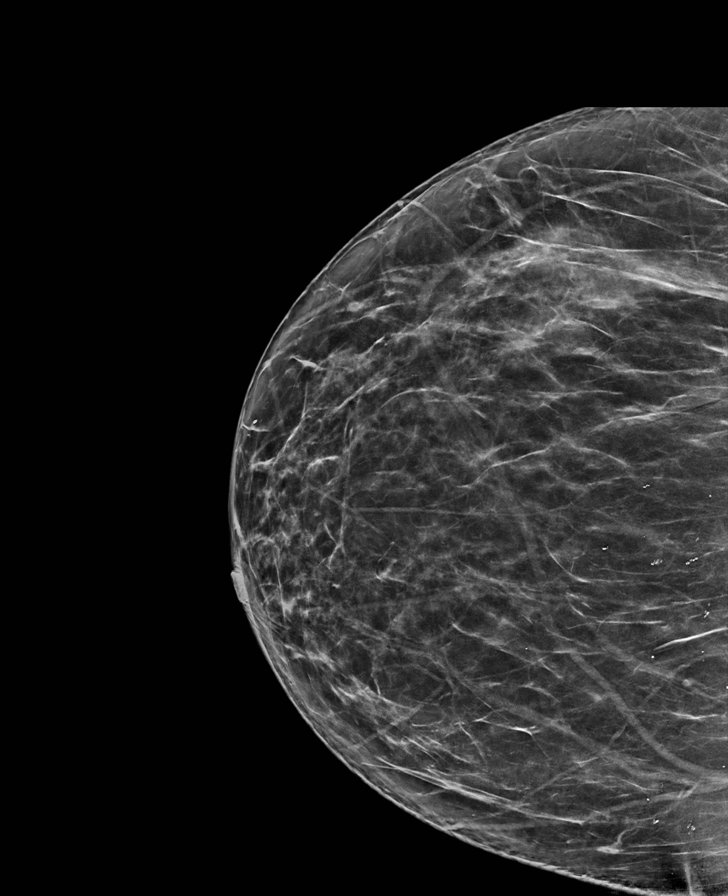

[R MLO synth-2D (1 of 2)]
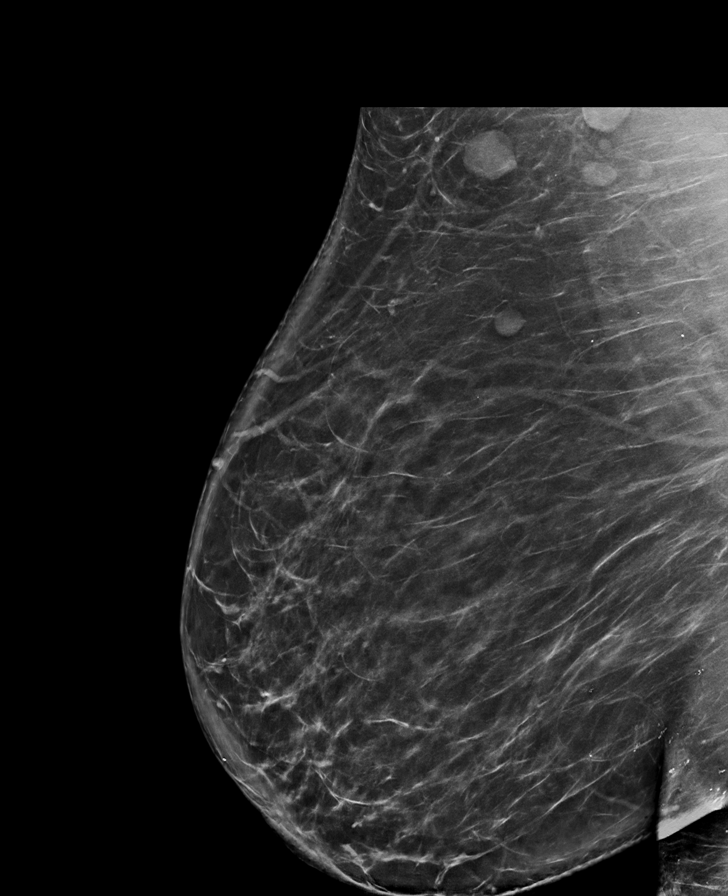

[L CV synth-2D]
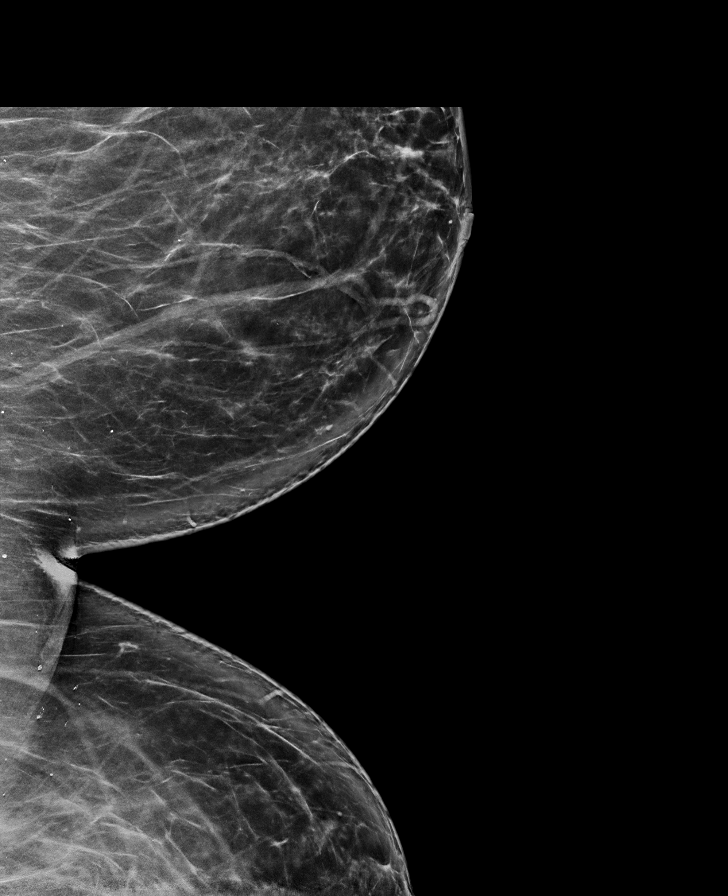

[L MLO synth-2D (2 of 2)]
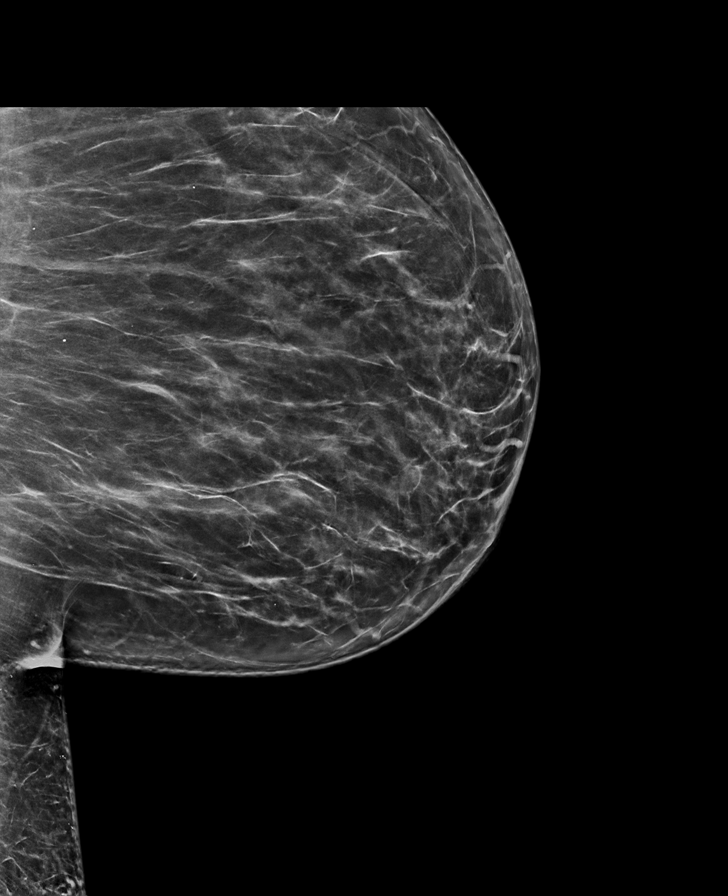

[R MLO synth-2D (2 of 2)]
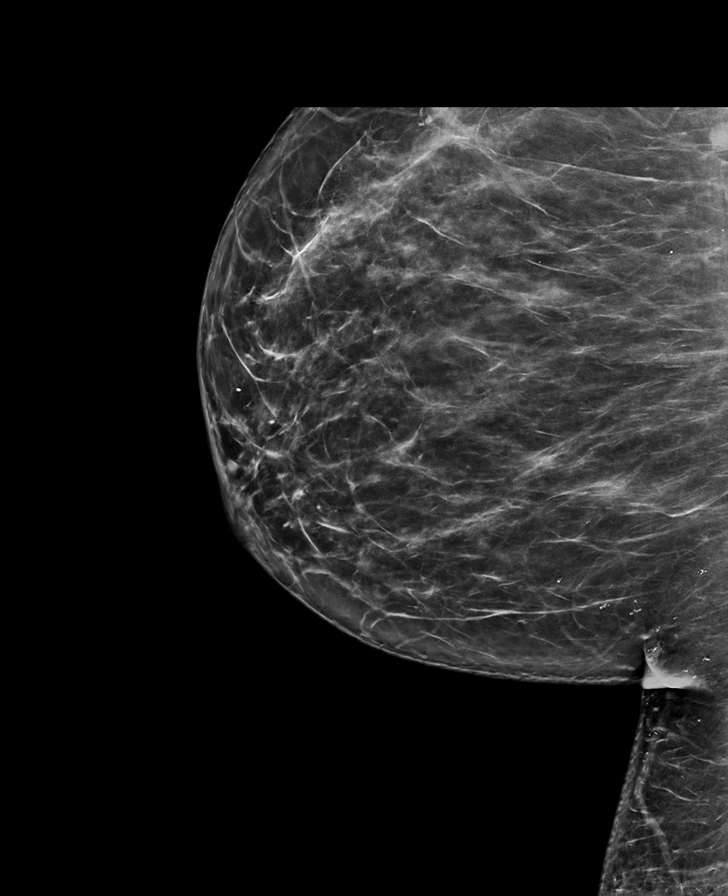

[R CC tomo · tomo slice 40/79.0]
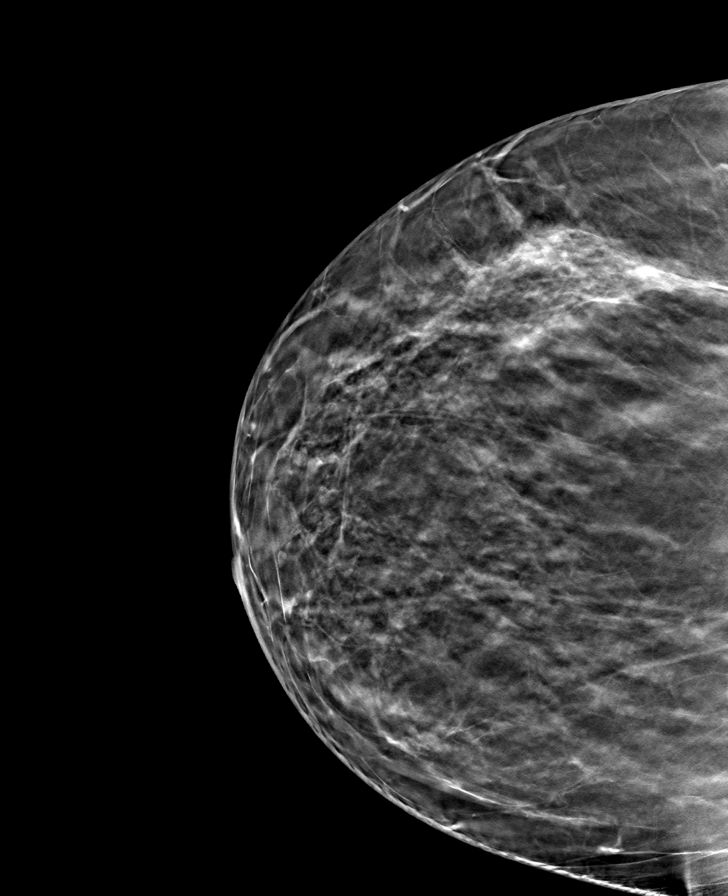

[8 of 40 positions shown; findings below may reference images not displayed]

ACR Breast Density Category b: There are scattered areas of
fibroglandular density.
FINDINGS: In the left axilla, a possible mass warrants further evaluation. In
the right axilla, a possible mass warrants further evaluation.

No evidence of malignancy in the bilateral breasts.

Images were processed with CAD.
IMPRESSION: Further evaluation is suggested for possible masses in the bilateral
axilla.

RECOMMENDATION:
Bilateral axillary ultrasound.

The patient will be contacted regarding the findings, and additional
imaging will be scheduled.

BI-RADS CATEGORY  0: Incomplete. Need additional imaging evaluation
and/or prior mammograms for comparison.

## 2021-07-21 IMAGING — US US AXILLARY RIGHT
1 series · 6 of 6 positions shown · non-contrast
Comparison: No prior ultrasound. Baseline screening mammography
04/23/2019.

CLINICAL DATA: Recall from baseline screening mammography, possible
enlarged lymph nodes in both axilla. The breasts had a normal
mammographic appearance.

Family history of breast cancer in her paternal great grandmother
and in a paternal aunt.
EXAM:
ULTRASOUND OF THE BILATERAL AXILLAE

[Series 1: us axillary right · 0.08mm/px · 6 of 6 slices shown]
[im 1/6]
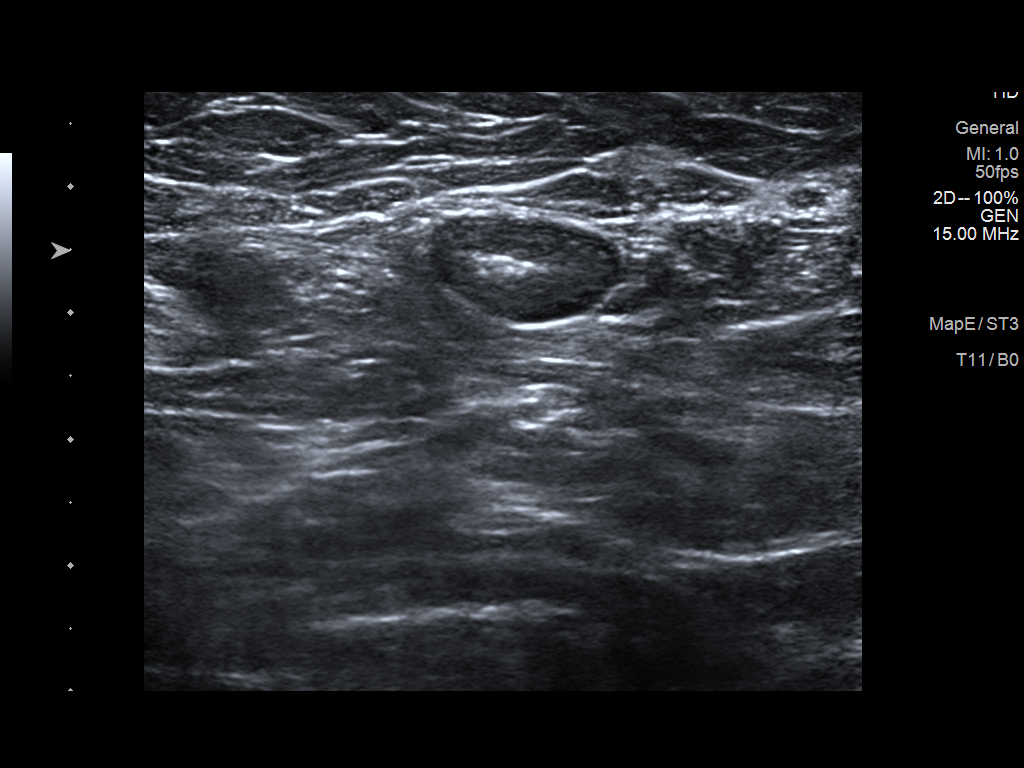
[im 2/6]
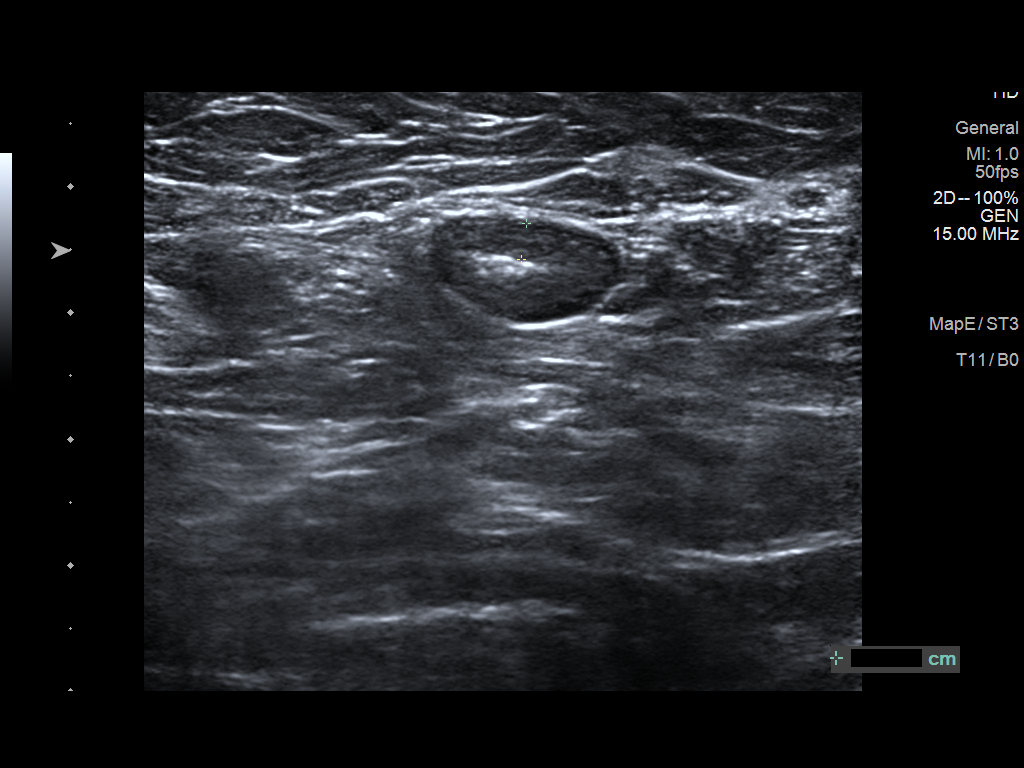
[im 3/6]
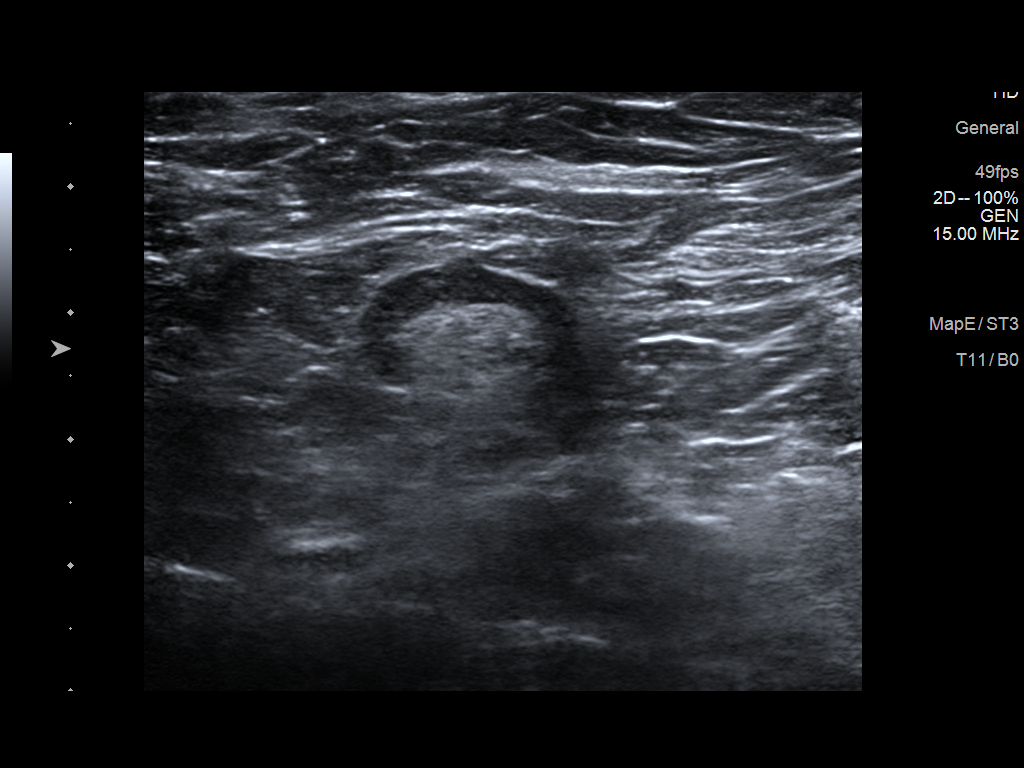
[im 4/6]
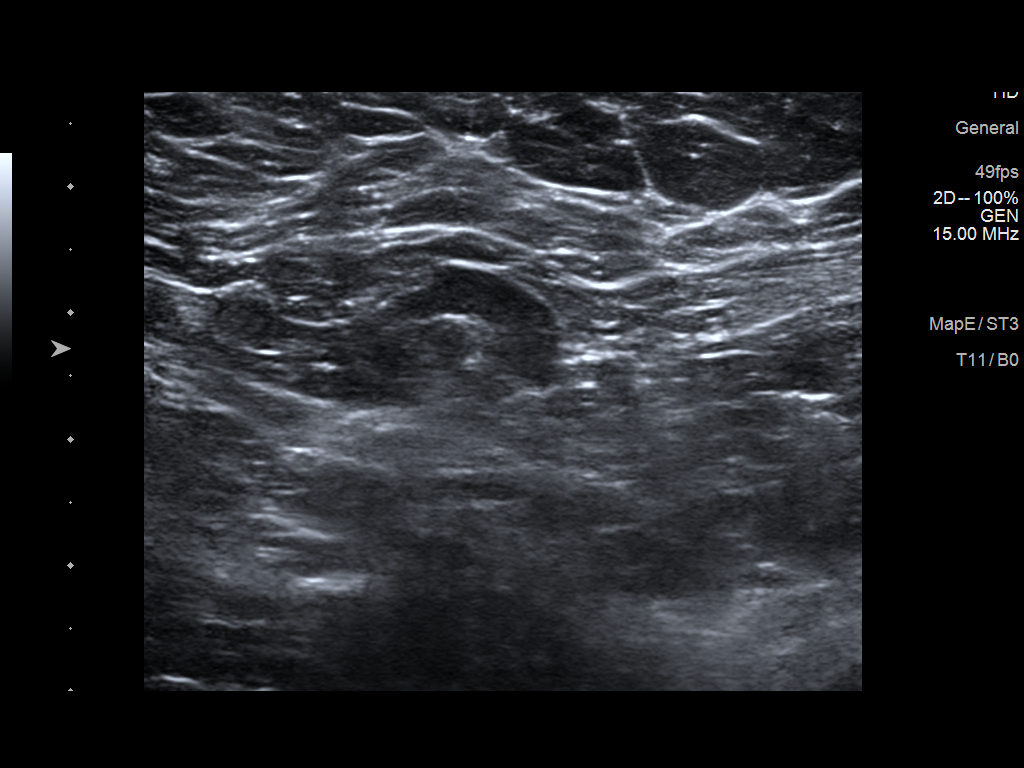
[im 5/6]
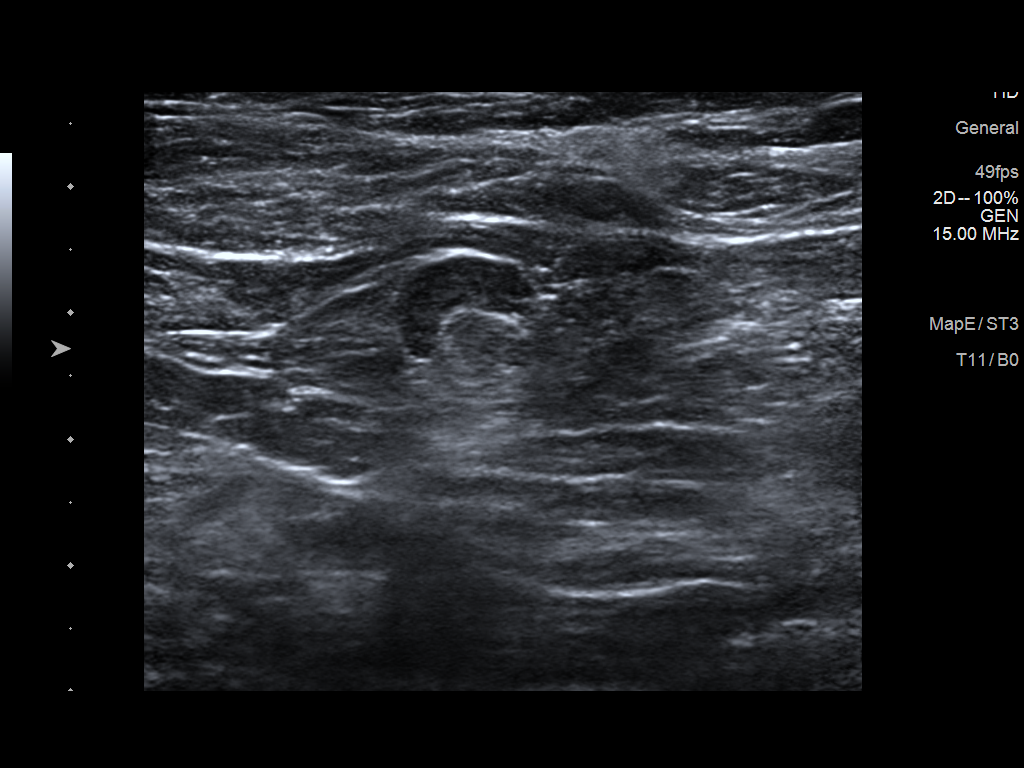
[im 6/6]
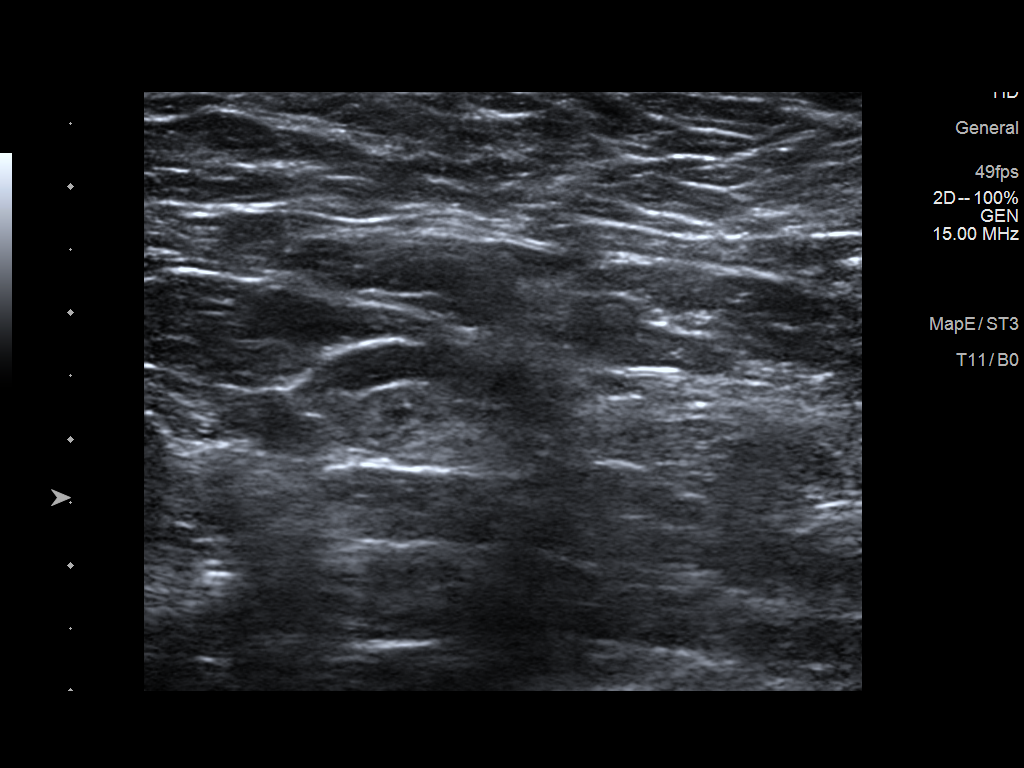

[6 of 6 positions shown; findings below may reference images not displayed]

FINDINGS: RIGHT: Multiple normal appearing lymph nodes with normal morphology
and no cortical thickening are identified in the RIGHT axilla. There
is no mass or pathologic lymphadenopathy.

LEFT: Multiple normal appearing lymph nodes with normal morphology
are present in the LEFT axilla. There is a solitary lymph node with
normal morphology demonstrating minimal cortical thickening up to 5
mm.
IMPRESSION: 1. Solitary LEFT axillary lymph node with normal morphology
demonstrating minimal cortical thickening up to 5 mm, likely
benign/reactive.
2. Otherwise, no pathologic lymphadenopathy in either axilla.

RECOMMENDATION:
LEFT axillary ultrasound in 3 months to confirm stability or
resolution of the solitary likely benign/reactive node.

I have discussed the findings and recommendations with the patient.
I also counseled the patient that should she received her [id number]
vaccine before the follow-up ultrasound, she should receive it in
the RIGHT arm so as not to confuse the issue in the LEFT axilla. If
applicable, a reminder letter will be sent to the patient regarding
the next appointment.

BI-RADS CATEGORY  3: Probably benign.

## 2021-10-08 ENCOUNTER — Other Ambulatory Visit: Payer: Self-pay | Admitting: Hematology & Oncology

## 2021-10-08 DIAGNOSIS — D500 Iron deficiency anemia secondary to blood loss (chronic): Secondary | ICD-10-CM

## 2021-12-16 ENCOUNTER — Other Ambulatory Visit: Payer: Self-pay | Admitting: Family Medicine

## 2021-12-16 DIAGNOSIS — I10 Essential (primary) hypertension: Secondary | ICD-10-CM

## 2022-06-08 ENCOUNTER — Ambulatory Visit: Payer: 59 | Admitting: Obstetrics & Gynecology

## 2022-07-20 ENCOUNTER — Ambulatory Visit: Payer: 59 | Admitting: Obstetrics & Gynecology

## 2023-02-06 ENCOUNTER — Other Ambulatory Visit: Payer: Self-pay | Admitting: Family Medicine

## 2023-02-06 DIAGNOSIS — I10 Essential (primary) hypertension: Secondary | ICD-10-CM

## 2023-08-02 ENCOUNTER — Ambulatory Visit: Payer: Self-pay | Admitting: Family Medicine

## 2023-08-02 ENCOUNTER — Telehealth: Payer: Self-pay

## 2023-08-02 ENCOUNTER — Other Ambulatory Visit (HOSPITAL_COMMUNITY): Payer: Self-pay

## 2023-08-02 ENCOUNTER — Other Ambulatory Visit (HOSPITAL_BASED_OUTPATIENT_CLINIC_OR_DEPARTMENT_OTHER): Payer: Self-pay

## 2023-08-02 ENCOUNTER — Ambulatory Visit: Admitting: Family Medicine

## 2023-08-02 ENCOUNTER — Encounter: Payer: Self-pay | Admitting: Family

## 2023-08-02 VITALS — BP 146/88 | HR 90 | Temp 98.0°F | Resp 16 | Ht 63.0 in | Wt 307.9 lb

## 2023-08-02 DIAGNOSIS — D500 Iron deficiency anemia secondary to blood loss (chronic): Secondary | ICD-10-CM | POA: Diagnosis not present

## 2023-08-02 DIAGNOSIS — Z6841 Body Mass Index (BMI) 40.0 and over, adult: Secondary | ICD-10-CM | POA: Diagnosis not present

## 2023-08-02 DIAGNOSIS — Z0000 Encounter for general adult medical examination without abnormal findings: Secondary | ICD-10-CM

## 2023-08-02 DIAGNOSIS — Z1231 Encounter for screening mammogram for malignant neoplasm of breast: Secondary | ICD-10-CM

## 2023-08-02 DIAGNOSIS — Z13220 Encounter for screening for lipoid disorders: Secondary | ICD-10-CM

## 2023-08-02 DIAGNOSIS — I10 Essential (primary) hypertension: Secondary | ICD-10-CM

## 2023-08-02 DIAGNOSIS — Z1211 Encounter for screening for malignant neoplasm of colon: Secondary | ICD-10-CM

## 2023-08-02 LAB — COMPREHENSIVE METABOLIC PANEL WITH GFR
ALT: 7 U/L (ref 0–35)
AST: 10 U/L (ref 0–37)
Albumin: 4.2 g/dL (ref 3.5–5.2)
Alkaline Phosphatase: 77 U/L (ref 39–117)
BUN: 11 mg/dL (ref 6–23)
CO2: 29 meq/L (ref 19–32)
Calcium: 9.4 mg/dL (ref 8.4–10.5)
Chloride: 104 meq/L (ref 96–112)
Creatinine, Ser: 0.85 mg/dL (ref 0.40–1.20)
GFR: 80.19 mL/min (ref >60.00)
Glucose, Bld: 92 mg/dL (ref 70–99)
Potassium: 4.1 meq/L (ref 3.5–5.1)
Sodium: 141 meq/L (ref 135–145)
Total Bilirubin: 0.5 mg/dL (ref 0.2–1.2)
Total Protein: 7.8 g/dL (ref 6.0–8.3)

## 2023-08-02 LAB — CBC
HCT: 38.4 % (ref 36.0–46.0)
Hemoglobin: 12.2 g/dL (ref 12.0–15.0)
MCHC: 31.8 g/dL (ref 30.0–36.0)
MCV: 81.2 fl (ref 78.0–100.0)
Platelets: 420 10*3/uL — ABNORMAL HIGH (ref 150.0–400.0)
RBC: 4.72 Mil/uL (ref 3.87–5.11)
RDW: 14.9 % (ref 11.5–15.5)
WBC: 9.5 10*3/uL (ref 4.0–10.5)

## 2023-08-02 LAB — LIPID PANEL
Cholesterol: 216 mg/dL — ABNORMAL HIGH (ref 0–200)
HDL: 44.6 mg/dL (ref >39.00)
LDL Cholesterol: 145 mg/dL — ABNORMAL HIGH (ref 0–99)
NonHDL: 171.45
Total CHOL/HDL Ratio: 5
Triglycerides: 130 mg/dL (ref 0.0–149.0)
VLDL: 26 mg/dL (ref 0.0–40.0)

## 2023-08-02 MED ORDER — ZEPBOUND 15 MG/0.5ML ~~LOC~~ SOAJ
15.0000 mg | SUBCUTANEOUS | 1 refills | Status: DC
  Filled 2023-08-02: qty 6, 84d supply, fill #0

## 2023-08-02 MED ORDER — FOLIC ACID 1 MG PO TABS: 1.0000 mg | ORAL_TABLET | Freq: Every day | ORAL | 3 refills | Status: DC

## 2023-08-02 MED ORDER — SERTRALINE HCL 50 MG PO TABS: 50.0000 mg | ORAL_TABLET | Freq: Every day | ORAL | Status: AC

## 2023-08-02 MED ORDER — ZEPBOUND 10 MG/0.5ML ~~LOC~~ SOAJ
10.0000 mg | SUBCUTANEOUS | 0 refills | Status: DC
  Filled 2023-08-02: qty 2, 28d supply, fill #0

## 2023-08-02 MED ORDER — ZEPBOUND 12.5 MG/0.5ML ~~LOC~~ SOAJ
12.5000 mg | SUBCUTANEOUS | 0 refills | Status: DC
Start: 2023-11-22 — End: 2023-10-11
  Filled 2023-08-02: qty 2, 28d supply, fill #0

## 2023-08-02 MED ORDER — ZEPBOUND 2.5 MG/0.5ML ~~LOC~~ SOAJ
2.5000 mg | SUBCUTANEOUS | 0 refills | Status: AC
  Filled 2023-08-02: qty 2, 28d supply, fill #0

## 2023-08-02 MED ORDER — HYDROXYZINE PAMOATE 25 MG PO CAPS: 25.0000 mg | ORAL_CAPSULE | Freq: Three times a day (TID) | ORAL | Status: AC | PRN

## 2023-08-02 MED ORDER — ZEPBOUND 7.5 MG/0.5ML ~~LOC~~ SOAJ
7.5000 mg | SUBCUTANEOUS | 0 refills | Status: DC
  Filled 2023-08-02 – 2023-10-07 (×2): qty 2, 28d supply, fill #0

## 2023-08-02 MED ORDER — ZEPBOUND 5 MG/0.5ML ~~LOC~~ SOAJ
5.0000 mg | SUBCUTANEOUS | 0 refills | Status: AC
Start: 2023-08-30 — End: 2023-10-05
  Filled 2023-08-02 – 2023-09-06 (×2): qty 2, 28d supply, fill #0

## 2023-08-02 NOTE — Patient Instructions (Addendum)
 Give us  2-3 business days to get the results of your labs back.   Keep the diet clean and stay active.  Please get me a copy of your advanced directive form at your convenience.   If you do not hear anything about your referral in the next 1-2 weeks, call our office and ask for an update.  Consider OTC clotrimazole twice daily for 2 weeks.   Check your blood pressures 2-3 times per week, alternating the time of day you check it. If it is high, considering waiting 1-2 minutes and rechecking. If it gets higher, your anxiety is likely creeping up and we should avoid rechecking.   Let me know if there are cost issues with the new medicines.   Let us  know if you need anything.

## 2023-08-02 NOTE — Telephone Encounter (Signed)
 Pharmacy Patient Advocate Encounter   Received notification from CoverMyMeds that prior authorization for Zepbound is required/requested.   Insurance verification completed.   The patient is insured through [facility] .   Per test claim: PA required; PA submitted to above mentioned insurance via CoverMyMeds Key/confirmation #/EOC [id number] Status is pending

## 2023-08-02 NOTE — Progress Notes (Signed)
 Chief Complaint  Patient presents with   Annual Exam    CPE      Well Woman Vicki Lawrence is here for a complete physical.   Her last physical was >1 year ago.  Current diet: in general, an OK diet. Current exercise: none. Weight is stable and she denies fatigue out of ordinary. Seatbelt? Yes Advanced directive? No  Health Maintenance Pap/HPV- Yes Mammogram- Due CCS- Due Tetanus- Yes Hep C screening- Yes HIV screening- Yes  Patient has a history of obesity.  She has been having trouble losing weight.  Diet/exercise as above.  She did weight watchers many years ago which did help but she has not done anything recently.  She has never been on the medication before.  She is not interested in bariatric surgery.  Past Medical History:  Diagnosis Date   Ectopic pregnancy without intrauterine pregnancy    Headache    Hypertension    Migraines      Past Surgical History:  Procedure Laterality Date   ECTOPIC PREGNANCY SURGERY  1992   Left salpingectomy per patient   ECTOPIC PREGNANCY SURGERY      Medications  Current Outpatient Medications on File Prior to Visit  Medication Sig Dispense Refill   lisinopril -hydrochlorothiazide  (ZESTORETIC ) 20-25 MG tablet TAKE 1 TABLET DAILY 90 tablet 3   Multiple Vitamin (MULTIVITAMIN) tablet Take 1 tablet by mouth daily.     rosuvastatin  (CRESTOR ) 20 MG tablet TAKE 1 TABLET DAILY 90 tablet 3   Allergies No Known Allergies  Review of Systems: Constitutional:  no unexpected weight changes Eye:  no recent significant change in vision Ear/Nose/Mouth/Throat:  Ears:  no recent change in hearing Nose/Mouth/Throat:  no complaints of nasal congestion, no sore throat Cardiovascular: no chest pain Respiratory:  no shortness of breath Gastrointestinal:  no abdominal pain, no change in bowel habits GU:  Female: negative for dysuria or pelvic pain Musculoskeletal/Extremities:  no pain of the joints Integumentary (Skin/Breast):  +  Darkness in her armpits Neurologic:  no headaches Endocrine:  denies fatigue Hematologic/Lymphatic:  No areas of easy bleeding  Exam BP (!) 142/86 (BP Location: Left Arm, Patient Position: Sitting)   Pulse 90   Temp 98 F (36.7 C) (Oral)   Resp 16   Ht 5' 3 (1.6 m)   Wt (!) 307 lb 14.4 oz (139.7 kg)   SpO2 97%   BMI 54.54 kg/m  General:  well developed, well nourished, in no apparent distress Skin: Patches in both axillary regions; otherwise no significant moles, warts, or growths Head:  no masses, lesions, or tenderness Eyes:  pupils equal and round, sclera anicteric without injection Ears:  canals without lesions, TMs shiny without retraction, no obvious effusion, no erythema Nose:  nares patent, mucosa normal, and no drainage Throat/Pharynx:  lips and gingiva without lesion; tongue and uvula midline; non-inflamed pharynx; no exudates or postnasal drainage Neck: neck supple without adenopathy, thyromegaly, or masses Lungs:  clear to auscultation, breath sounds equal bilaterally, no respiratory distress Cardio:  regular rate and rhythm, no LE edema Abdomen:  abdomen soft, nontender; bowel sounds normal; no masses or organomegaly Genital: Defer to GYN Musculoskeletal:  symmetrical muscle groups noted without atrophy or deformity Extremities:  no clubbing, cyanosis, or edema, no deformities, no skin discoloration Neuro:  gait normal; deep tendon reflexes normal and symmetric Psych: well oriented with normal range of affect and appropriate judgment/insight  Assessment and Plan  Well adult exam - Plan: CBC, Comprehensive metabolic panel with GFR, Lipid panel  Screen for colon cancer - Plan: Ambulatory referral to Gastroenterology  Encounter for screening mammogram for malignant neoplasm of breast - Plan: MM DIGITAL SCREENING BILATERAL  Iron  deficiency anemia due to chronic blood loss - Plan: folic acid  (FOLVITE ) 1 MG tablet   Well 48 y.o. female. Counseled on diet and  exercise. Advanced directive form provided today.  CCS: Refer GI.  Looks like she has some intertrigo.  Over-the-counter clotrimazole twice daily for 2 weeks recommended. Obesity: Chronic, not controlled.  Counseled on diet and exercise.  Start Zepbound 2.5 mg weekly and titrate up.  She will let me know if there are any cost issues.  Sounds like there are some emotional eating factors at play.  She is seeing the psychiatry team.  She is on Zoloft 50 mg daily and Atarax 25 mg as needed.  Would consider Wegovy versus Wellbutrin if insurance will not pay for the medication. Hypertension: Not controlled today.  She will start monitoring her blood pressure at home.  Hopefully the above weight loss will help with this.  She will continue medication for now and we will see her in 6 weeks to recheck this. Mammogram ordered.  Other orders as above. Follow up in 6 mo. The patient voiced understanding and agreement to the plan.  Mabel Mt [place], DO 08/02/23 7:46 AM

## 2023-08-02 NOTE — Telephone Encounter (Signed)
 Pharmacy Patient Advocate Encounter  Received notification from [facility] that Prior Authorization for Zepbound 2.5MG /0.5ML [place] SOAJ has been APPROVED from 08-02-2023 to 03-29-2024   PA #/Case ID/Reference #: [id number]

## 2023-08-07 ENCOUNTER — Telehealth (HOSPITAL_BASED_OUTPATIENT_CLINIC_OR_DEPARTMENT_OTHER): Payer: Self-pay

## 2023-09-06 ENCOUNTER — Other Ambulatory Visit (HOSPITAL_BASED_OUTPATIENT_CLINIC_OR_DEPARTMENT_OTHER): Payer: Self-pay

## 2023-09-13 ENCOUNTER — Ambulatory Visit: Admitting: Family Medicine

## 2023-09-13 ENCOUNTER — Encounter: Payer: Self-pay | Admitting: Family Medicine

## 2023-09-13 VITALS — BP 132/76 | HR 99 | Temp 98.0°F | Resp 16 | Ht 63.0 in | Wt 295.2 lb

## 2023-09-13 DIAGNOSIS — I10 Essential (primary) hypertension: Secondary | ICD-10-CM

## 2023-09-13 NOTE — Patient Instructions (Addendum)
 Keep up the good work.  Aim to do some physical exertion for 150 minutes per week. This is typically divided into 5 days per week, 30 minutes per day. The activity should be enough to get your heart rate up. Anything is better than nothing if you have time constraints.  Please consider adding some weight resistance exercise to your routine. Consider yoga as well.   Check your blood pressures 1-2 times per week, alternating the time of day you check it. If it is high, considering waiting 1-2 minutes and rechecking. If it gets higher, your anxiety is likely creeping up and we should avoid rechecking. If it starts getting lower, let me know and we may need to decrease your medicine.   Please do not get pregnant on these medicines. If you wish us  to send in a birth control pill.   Let us  know if you need anything.

## 2023-09-13 NOTE — Progress Notes (Signed)
 Chief Complaint  Patient presents with   Follow-up    Follow up    Subjective Vicki Lawrence is a 48 y.o. female who presents for hypertension follow up. She does monitor home blood pressures. Blood pressures ranging from 130's/80's on average. She is compliant with medications- Prinzide  20-25 mg/d. Patient has these side effects of medication: none She is adhering to a healthy diet overall. Current exercise: walking No CP or SOB.   Patient has a history of obesity.  She has lost 12 pounds since starting Zepbound .  She is currently on 5 mg weekly.  She is compliant and has no side effects.  Appetite and cravings for sugar have decreased.  She is walking for exercise.   Past Medical History:  Diagnosis Date   Ectopic pregnancy without intrauterine pregnancy    Headache    Hypertension    Migraines     Exam BP 132/76 (BP Location: Left Arm, Cuff Size: Large)   Pulse 99   Temp 98 F (36.7 C) (Oral)   Resp 16   Ht 5' 3 (1.6 m)   Wt 295 lb 3.2 oz (133.9 kg)   SpO2 98%   BMI 52.29 kg/m  General:  well developed, well nourished, in no apparent distress Heart: RRR, no bruits, no LE edema Lungs: clear to auscultation, no accessory muscle use Psych: well oriented with normal range of affect and appropriate judgment/insight  Essential hypertension  Morbid obesity (HCC)  Chronic, now stable.  Continue Zestoretic  20-25 mg daily.  Monitor blood pressure at home should it start to drop as she continues to lose weight.  Counseled on diet and exercise. Chronic, improving.  She will continue to titrate up the dosage of Zepbound , currently on 5 mg weekly.  She will let me know if she wishes to stay at a specific dosage.  Counseled on diet and exercise.  Recommended adding strength training/weight resistance exercise. F/u in 6 mo. The patient voiced understanding and agreement to the plan.  [name], DO 09/13/23  4:24 PM

## 2023-10-07 ENCOUNTER — Other Ambulatory Visit (HOSPITAL_BASED_OUTPATIENT_CLINIC_OR_DEPARTMENT_OTHER): Payer: Self-pay

## 2023-10-09 ENCOUNTER — Other Ambulatory Visit (HOSPITAL_BASED_OUTPATIENT_CLINIC_OR_DEPARTMENT_OTHER): Payer: Self-pay

## 2023-10-10 ENCOUNTER — Other Ambulatory Visit (HOSPITAL_BASED_OUTPATIENT_CLINIC_OR_DEPARTMENT_OTHER): Payer: Self-pay

## 2023-10-10 ENCOUNTER — Telehealth: Payer: Self-pay | Admitting: *Deleted

## 2023-10-10 NOTE — Telephone Encounter (Signed)
 Copied from CRM #[id number]. Topic: Clinical - Medication Prior Auth >> Oct 10, 2023  5:16 PM [place] J wrote: Reason for CRM: The patient's tirzepatide  (ZEPBOUND ) 7.5 MG/0.5ML Pen is needing a prior authorization (per pharmacy).

## 2023-10-11 ENCOUNTER — Other Ambulatory Visit (HOSPITAL_BASED_OUTPATIENT_CLINIC_OR_DEPARTMENT_OTHER): Payer: Self-pay

## 2023-10-11 ENCOUNTER — Other Ambulatory Visit (HOSPITAL_COMMUNITY): Payer: Self-pay

## 2023-10-11 ENCOUNTER — Other Ambulatory Visit: Payer: Self-pay | Admitting: Family Medicine

## 2023-10-11 ENCOUNTER — Telehealth: Payer: Self-pay

## 2023-10-11 NOTE — Telephone Encounter (Signed)
 Pharmacy Patient Advocate Encounter   Received notification from Pt Calls Messages that prior authorization for Zepbound  7.5mg /0.2ml is required/requested.   Insurance verification completed.   The patient is insured through [facility] .   Per test claim: PA required; PA started via CoverMyMeds. KEY [id number] . Please see clinical question(s) below that I am not finding the answer to in their chart and advise.

## 2023-10-12 ENCOUNTER — Other Ambulatory Visit (HOSPITAL_BASED_OUTPATIENT_CLINIC_OR_DEPARTMENT_OTHER): Payer: Self-pay

## 2023-10-12 ENCOUNTER — Other Ambulatory Visit: Payer: Self-pay | Admitting: Family Medicine

## 2023-10-12 MED ORDER — SEMAGLUTIDE-WEIGHT MANAGEMENT 1.7 MG/0.75ML ~~LOC~~ SOAJ
1.7000 mg | SUBCUTANEOUS | 0 refills | Status: AC
  Filled 2023-10-12: qty 3, 28d supply, fill #0

## 2023-10-12 MED ORDER — SEMAGLUTIDE-WEIGHT MANAGEMENT 2.4 MG/0.75ML ~~LOC~~ SOAJ
2.4000 mg | SUBCUTANEOUS | 5 refills | Status: AC
  Filled 2023-10-12 – 2023-11-11 (×2): qty 3, 28d supply, fill #0
  Filled 2023-12-06: qty 3, 28d supply, fill #1
  Filled 2024-01-03: qty 3, 28d supply, fill #2
  Filled 2024-01-31: qty 3, 28d supply, fill #3
  Filled 2024-02-28: qty 3, 28d supply, fill #4

## 2023-10-12 NOTE — Telephone Encounter (Signed)
 Copied from CRM [id number]. Topic: Clinical - Medication Question >> Oct 12, 2023 12:15 PM Aisha D wrote: Reason for CRM: Pt is calling to get an update on her medication for the tirzepatide  (ZEPBOUND ) 7.5 MG/0.5ML Pen. Pt stated that she requested a refill but the pharmacy stated that the provider denied the request. I informed the pt that the insurance was requesting a PA but now they prefer Wegovy . Pt would like a callback with an update.

## 2023-10-12 NOTE — Telephone Encounter (Signed)
 Wegovy  sent to start after she finishes her 7.5 mg dosage.

## 2023-10-12 NOTE — Telephone Encounter (Signed)
 Pt.notified

## 2023-10-12 NOTE — Addendum Note (Signed)
 Addended by: FRANN MABEL SQUIBB on: 10/12/2023 12:36 PM   Modules accepted: Orders

## 2023-10-19 ENCOUNTER — Ambulatory Visit (HOSPITAL_BASED_OUTPATIENT_CLINIC_OR_DEPARTMENT_OTHER)
Admission: RE | Admit: 2023-10-19 | Discharge: 2023-10-19 | Disposition: A | Discharge status: Home / Self Care | Source: Ambulatory Visit | Attending: Family Medicine | Admitting: Family Medicine

## 2023-10-19 ENCOUNTER — Encounter (HOSPITAL_BASED_OUTPATIENT_CLINIC_OR_DEPARTMENT_OTHER): Payer: Self-pay

## 2023-10-19 DIAGNOSIS — Z1231 Encounter for screening mammogram for malignant neoplasm of breast: Secondary | ICD-10-CM | POA: Insufficient documentation

## 2023-11-12 ENCOUNTER — Other Ambulatory Visit (HOSPITAL_BASED_OUTPATIENT_CLINIC_OR_DEPARTMENT_OTHER): Payer: Self-pay

## 2023-11-13 ENCOUNTER — Other Ambulatory Visit (HOSPITAL_BASED_OUTPATIENT_CLINIC_OR_DEPARTMENT_OTHER): Payer: Self-pay

## 2024-01-24 ENCOUNTER — Other Ambulatory Visit: Payer: Self-pay

## 2024-01-24 ENCOUNTER — Encounter: Payer: Self-pay | Admitting: Family Medicine

## 2024-01-24 DIAGNOSIS — D500 Iron deficiency anemia secondary to blood loss (chronic): Secondary | ICD-10-CM

## 2024-01-24 MED ORDER — FOLIC ACID 1 MG PO TABS: 1.0000 mg | ORAL_TABLET | Freq: Every day | ORAL | 3 refills | Status: AC

## 2024-01-25 ENCOUNTER — Other Ambulatory Visit: Payer: Self-pay | Admitting: Family Medicine

## 2024-01-25 DIAGNOSIS — I10 Essential (primary) hypertension: Secondary | ICD-10-CM

## 2024-02-29 ENCOUNTER — Other Ambulatory Visit (HOSPITAL_BASED_OUTPATIENT_CLINIC_OR_DEPARTMENT_OTHER): Payer: Self-pay

## 2024-02-29 ENCOUNTER — Encounter: Payer: Self-pay | Admitting: Family Medicine

## 2024-03-15 ENCOUNTER — Encounter: Payer: Self-pay | Admitting: Family Medicine

## 2024-03-15 ENCOUNTER — Ambulatory Visit: Admitting: Family Medicine

## 2024-03-15 VITALS — BP 138/78 | HR 100 | Temp 98.0°F | Resp 16 | Ht 63.0 in | Wt 252.0 lb

## 2024-03-15 DIAGNOSIS — Z1211 Encounter for screening for malignant neoplasm of colon: Secondary | ICD-10-CM

## 2024-03-15 DIAGNOSIS — Z23 Encounter for immunization: Secondary | ICD-10-CM

## 2024-03-15 DIAGNOSIS — I10 Essential (primary) hypertension: Secondary | ICD-10-CM

## 2024-03-15 DIAGNOSIS — E7800 Pure hypercholesterolemia, unspecified: Secondary | ICD-10-CM

## 2024-03-15 NOTE — Patient Instructions (Addendum)
 Very strong work with your weight loss.  Give us  2-3 business days to get the results of your labs back.   Keep the diet clean and stay active.  Monitor blood pressure at home. If the top # goes lower than 110 consistently, please let me know.  The Shingrix vaccine (for shingles) is a 2 shot series spaced 2-6 months apart. It can make people feel low energy, achy and almost like they have the flu for 48 hours after injection. 1/5 people can have nausea and/or vomiting. Please plan accordingly when deciding on when to get this shot. Call our office for a nurse visit appointment to get this. The second shot of the series is less severe regarding the side effects, but it still lasts 48 hours.   If you do not hear anything about your referral in the next 1-2 weeks, call our office and ask for an update.  Stay hydrated.  Try 2 tablespoons of milk of mag in 4 oz of warm prune juice. Do that and wait a couple hours. If no improvement, try a Dulcolax suppository and then let me know if we are still having issues.   Let us  know if you need anything.

## 2024-03-15 NOTE — Progress Notes (Signed)
 Chief Complaint  Patient presents with   Medication Refill    Medication Refill     Subjective Vicki Lawrence is a 49 y.o. female who presents for hypertension follow up. She does monitor home blood pressures. Blood pressures ranging from 120's/80's on average. She is compliant with medication- Zestoretic  20-25 mg/d. Patient has these side effects of medication: none She is adhering to a healthy diet overall. Current exercise: walking No CP or SOB.   Hyperlipidemia Patient presents for dyslipidemia follow up. Currently being treated with Crestor  20 mg/d and compliance with treatment thus far has been good. She denies myalgias. Diet/exercise as above.  The patient is not known to have coexisting coronary artery disease.  Obesity Taking Wegovy  2.4 mg/week. Compliant, no AE's. Lost around 60 lbs. Diet/exercise as above.     Past Medical History:  Diagnosis Date   Ectopic pregnancy without intrauterine pregnancy    Headache    Hypertension    Migraines     Exam BP 138/78 (BP Location: Left Arm, Patient Position: Sitting)   Pulse 100   Temp 98 F (36.7 C) (Oral)   Resp 16   Ht 5' 3 (1.6 m)   Wt 252 lb (114.3 kg)   SpO2 98%   BMI 44.64 kg/m  General:  well developed, well nourished, in no apparent distress Heart: RRR, no bruits, no LE edema Lungs: clear to auscultation, no accessory muscle use Psych: well oriented with normal range of affect and appropriate judgment/insight  Essential hypertension  Morbid obesity (HCC)  Pure hypercholesterolemia - Plan: Comprehensive metabolic panel with GFR, Lipid panel  Screen for colon cancer - Plan: Ambulatory referral to Gastroenterology  Chronic, stable. Cont Zestoretic  20-25 mg/d. Monitor BP at home, let me know if SBP <110 consistently. Counseled on diet and exercise. Chronic, improving. Cont Wegovy  2.4 mg/week.  Chronic, stable. Cont Crestor  20 mg/d.  Refer GI. PCV20 today. Shingrix rec'd.  F/u in 6 mo. The  patient voiced understanding and agreement to the plan.  Mabel Mt [place], DO 03/15/24  1:26 PM

## 2024-03-15 NOTE — Addendum Note (Signed)
 Addended by: Waddell Iten M on: 03/15/2024 03:01 PM   Modules accepted: Orders

## 2024-09-18 ENCOUNTER — Encounter: Admitting: Family Medicine
